# Patient Record
Sex: Female | Born: 1965 | Race: Black or African American | Hispanic: No | Marital: Single | Smoking: Never smoker
Health system: Southern US, Community
[De-identification: ages and names within clinical notes are randomized; demographics above are authoritative.]

## PROBLEM LIST (undated history)

## (undated) DIAGNOSIS — IMO0001 Reserved for inherently not codable concepts without codable children: Secondary | ICD-10-CM

## (undated) DIAGNOSIS — R51 Headache: Secondary | ICD-10-CM

## (undated) DIAGNOSIS — K219 Gastro-esophageal reflux disease without esophagitis: Secondary | ICD-10-CM

## (undated) DIAGNOSIS — I10 Essential (primary) hypertension: Secondary | ICD-10-CM

## (undated) DIAGNOSIS — R519 Headache, unspecified: Secondary | ICD-10-CM

## (undated) DIAGNOSIS — F419 Anxiety disorder, unspecified: Secondary | ICD-10-CM

## (undated) HISTORY — PX: COLONOSCOPY: SHX174

## (undated) HISTORY — DX: Essential (primary) hypertension: I10

---

## 2008-03-17 HISTORY — PX: ABDOMINAL HYSTERECTOMY: SHX81

## 2009-11-20 ENCOUNTER — Emergency Department (HOSPITAL_COMMUNITY): Admission: EM | Admit: 2009-11-20 | Discharge: 2009-11-20 | Payer: Self-pay | Admitting: Family Medicine

## 2012-03-03 ENCOUNTER — Other Ambulatory Visit: Payer: Self-pay | Admitting: Otolaryngology

## 2012-03-03 DIAGNOSIS — R1310 Dysphagia, unspecified: Secondary | ICD-10-CM

## 2012-03-03 DIAGNOSIS — K219 Gastro-esophageal reflux disease without esophagitis: Secondary | ICD-10-CM

## 2012-03-23 ENCOUNTER — Inpatient Hospital Stay: Admission: RE | Admit: 2012-03-23 | Payer: Self-pay | Source: Ambulatory Visit

## 2012-05-01 IMAGING — CR DG SHOULDER 2+V*R*
3 series · 3 of 3 positions shown · non-contrast
Comparison: None.

CLINICAL DATA: Lateral right shoulder pain, no trauma

RIGHT SHOULDER - 2+ VIEW

[view not recorded (1 of 3)]
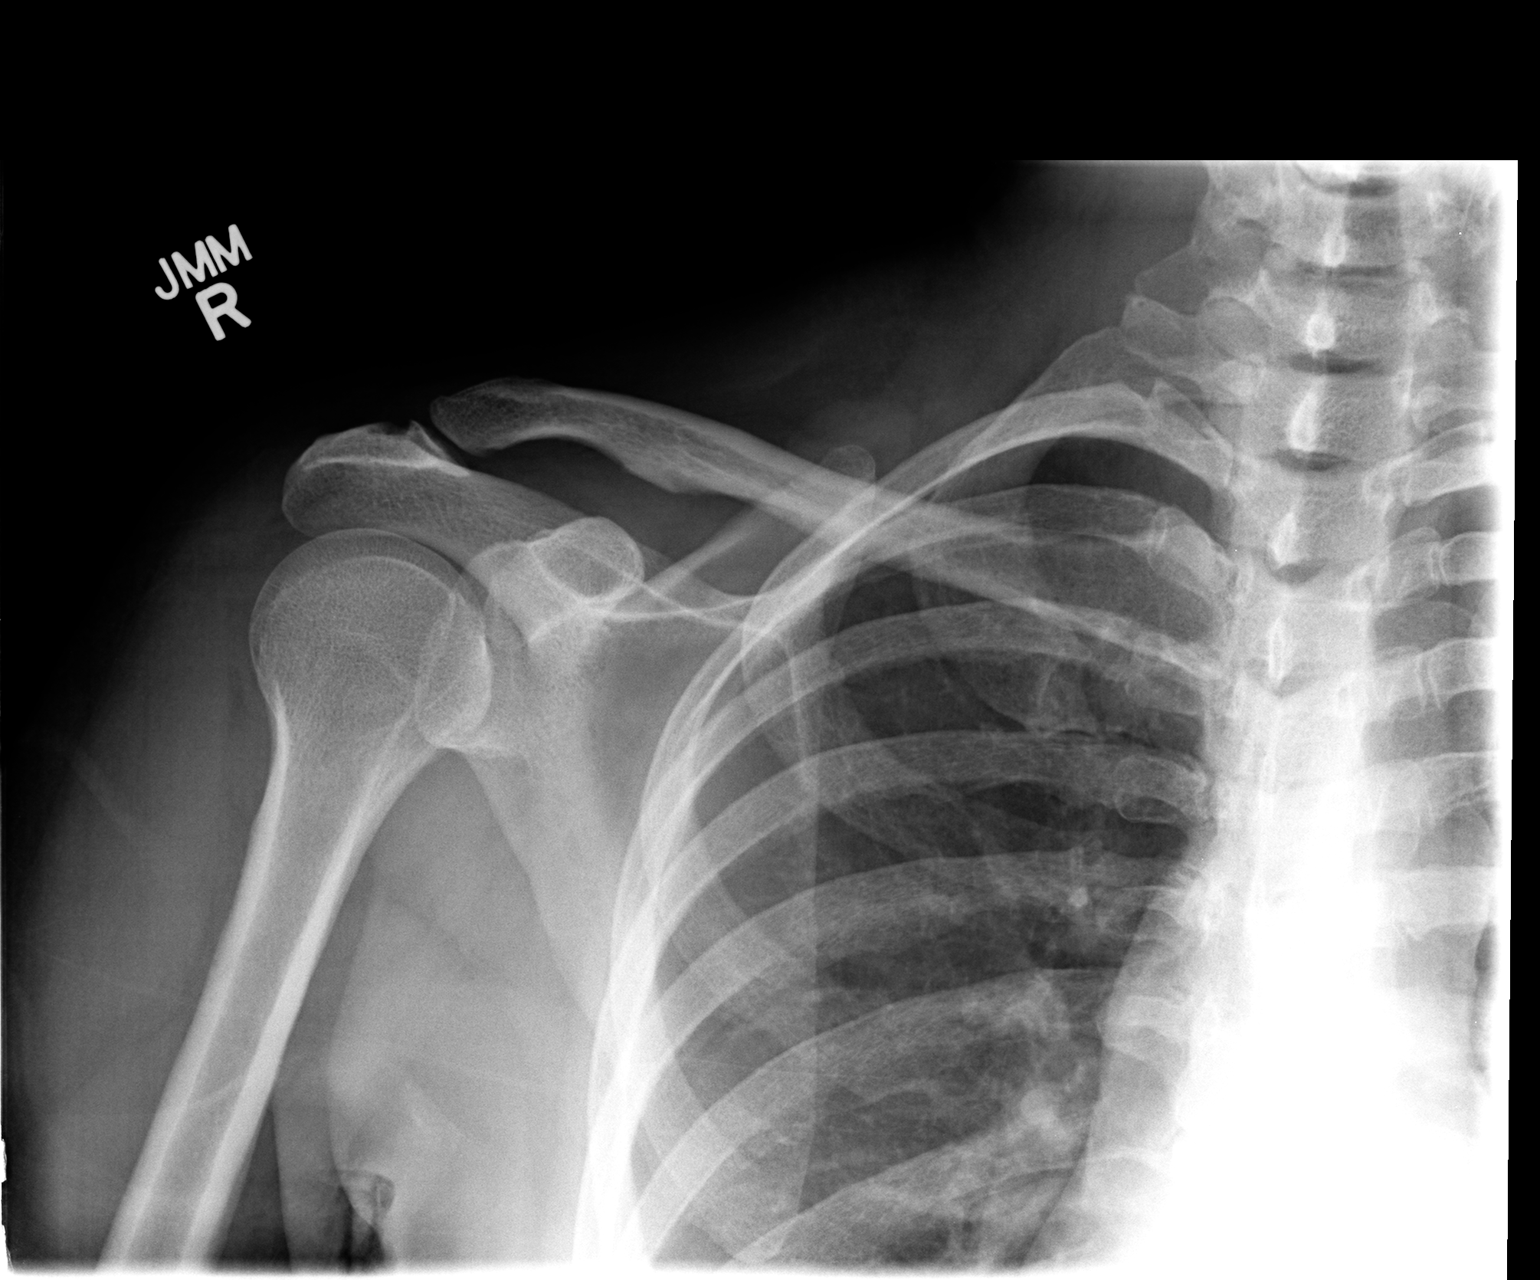

[view not recorded (2 of 3)]
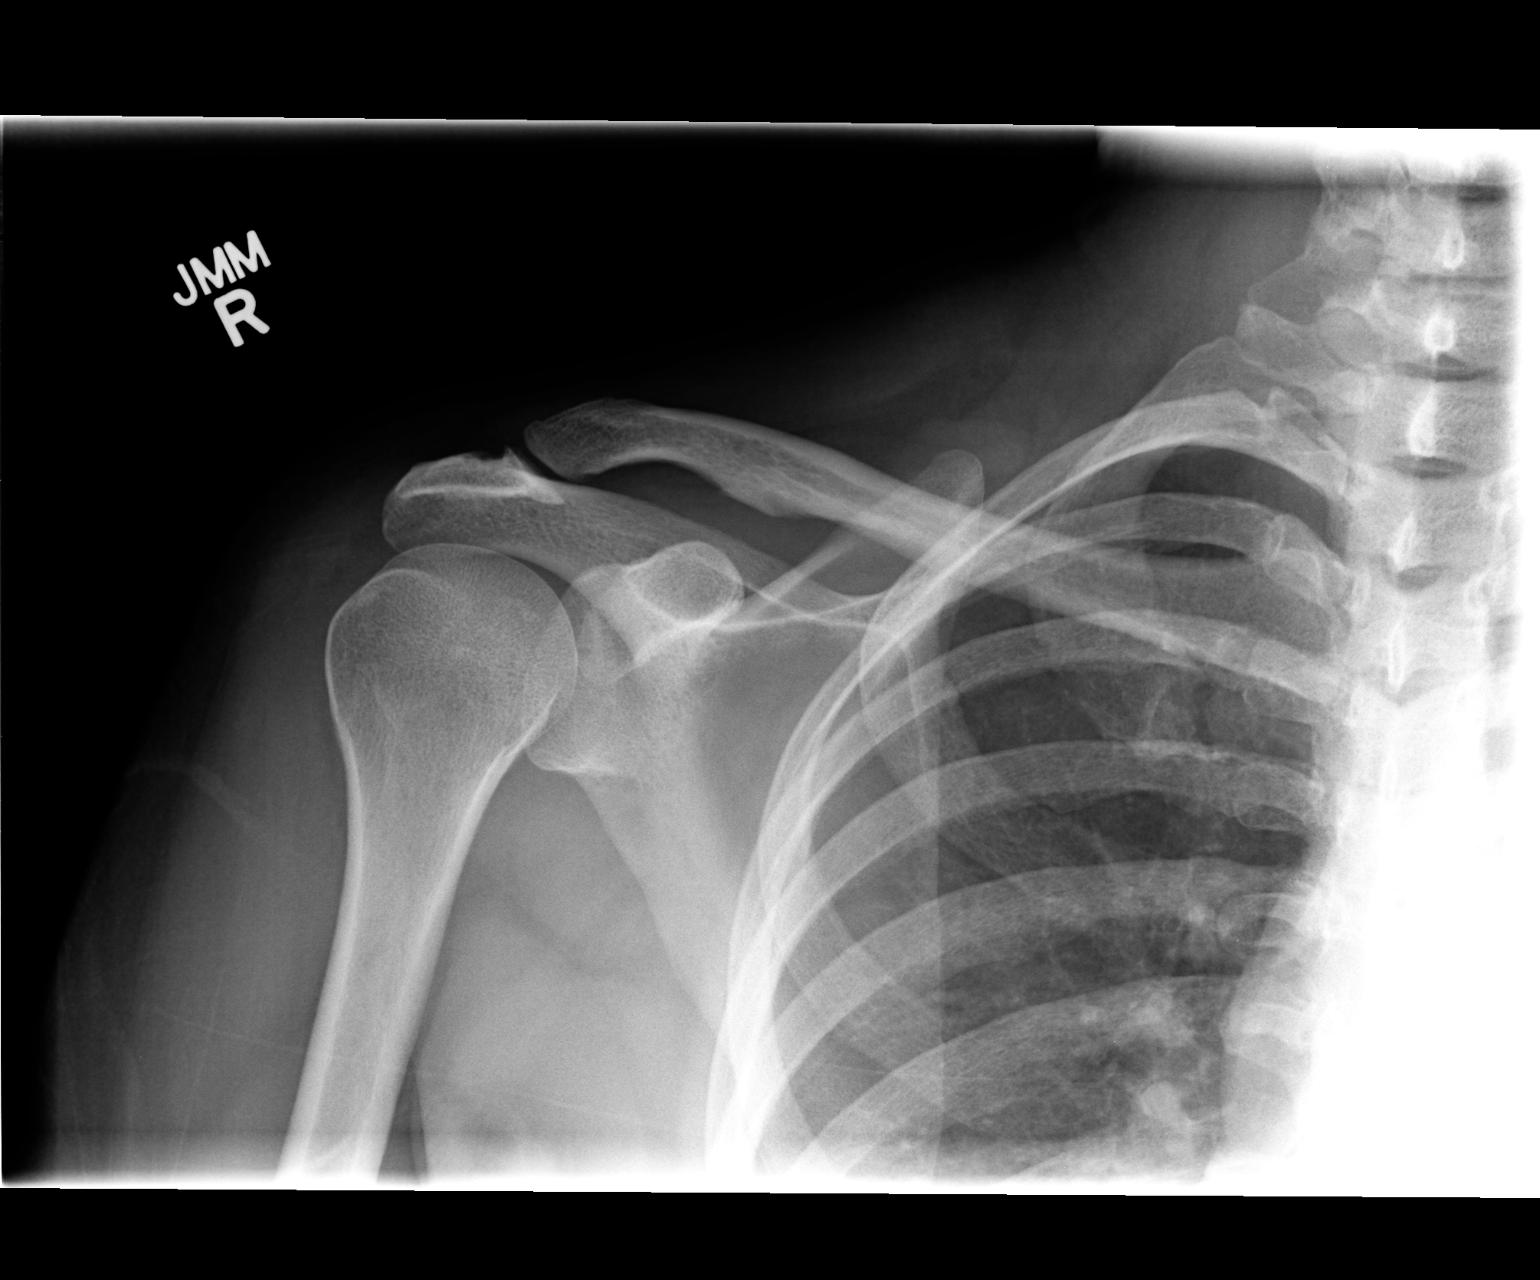

[view not recorded (3 of 3)]
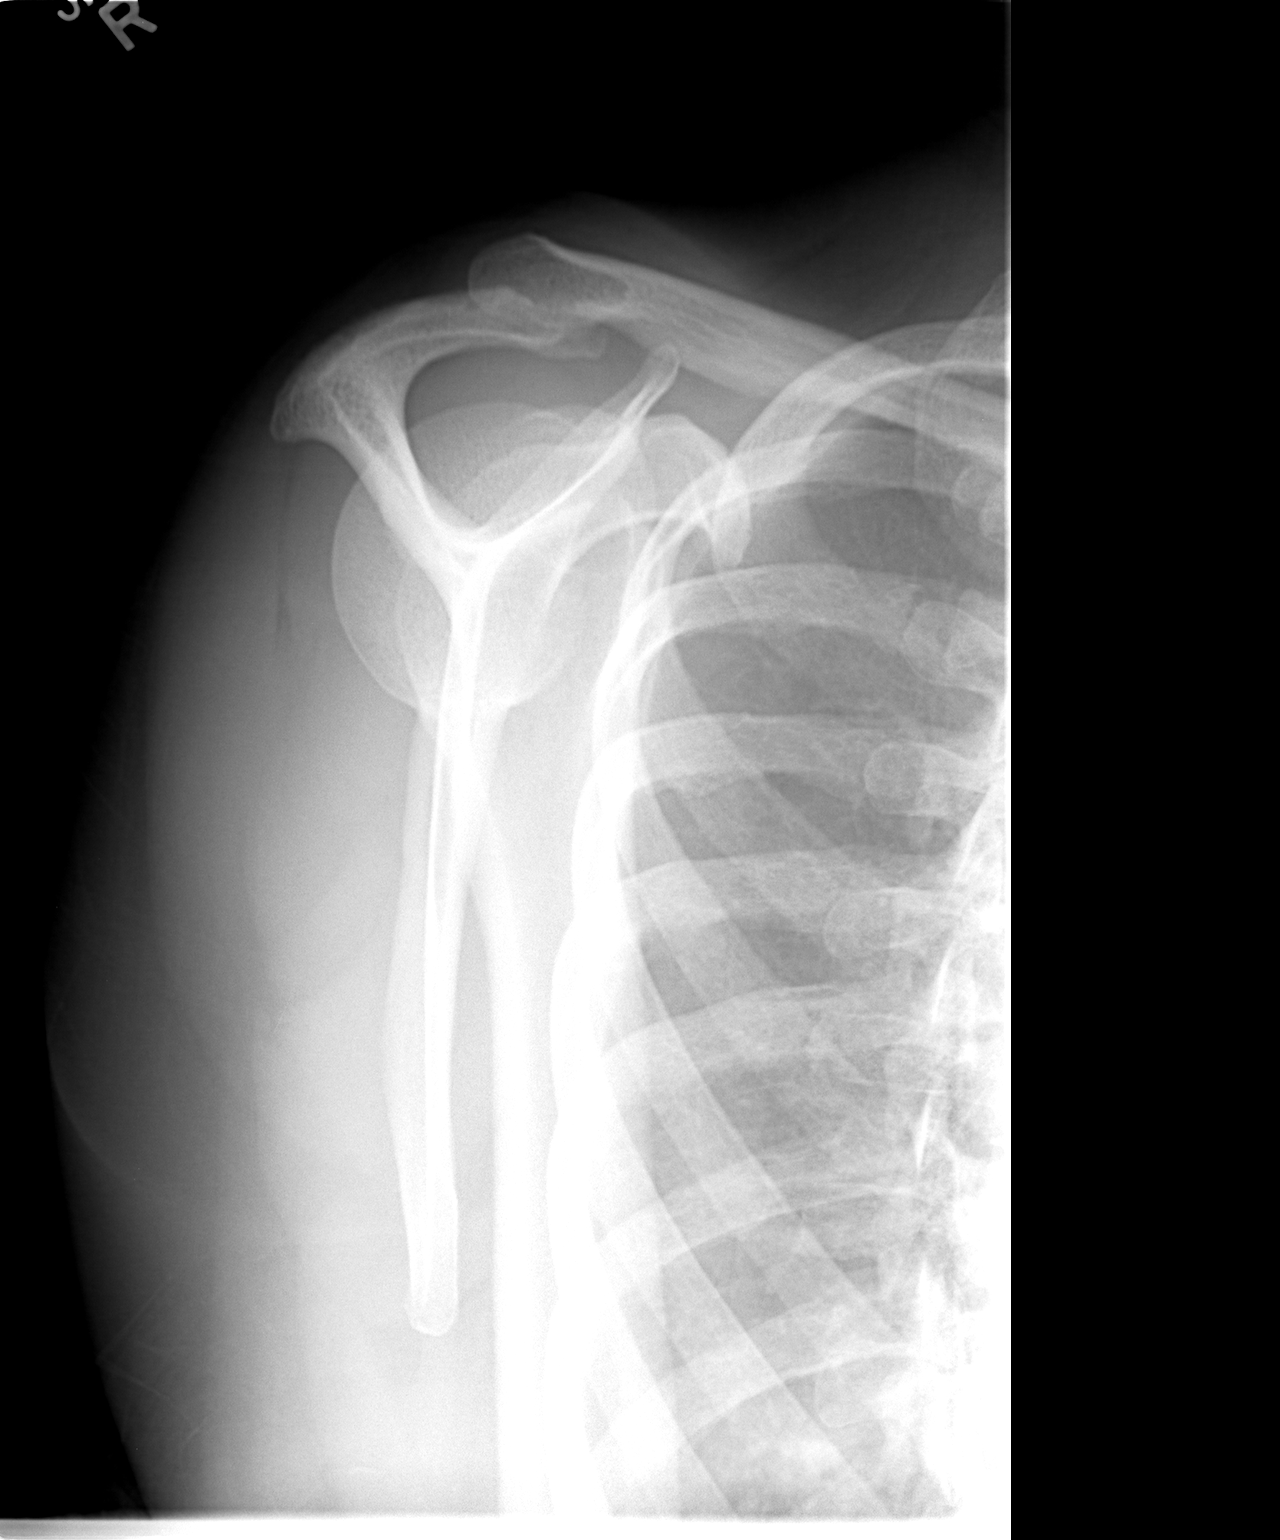

[3 of 3 positions shown; findings below may reference images not displayed]

FINDINGS: External rotation view is suboptimally rotated.  AC joint
distance is grossly normal.  No fracture dislocation.  Right lung
apex clear.
IMPRESSION: No acute osseous abnormality.

## 2013-03-17 HISTORY — PX: ESOPHAGEAL DILATION: SHX303

## 2014-05-15 ENCOUNTER — Other Ambulatory Visit: Payer: Self-pay | Admitting: Otolaryngology

## 2014-05-15 NOTE — H&P (Signed)
PREOPERATIVE H&P  Chief Complaint: sore throats  HPI: Samantha Estrada is a 49 y.o. female who presents for evaluation of recurrent sore throats and trouble breathing through her nose. She has large cryptic tonsils and a long uvula. Nasal exam reveals large turbinates and minimal septal deformity. She's taken to the OR for tonsillectomy and turbinate reductions.  No past medical history on file. No past surgical history on file. History   Social History  . Marital Status: Single    Spouse Name: N/A  . Number of Children: N/A  . Years of Education: N/A   Social History Main Topics  . Smoking status: Not on file  . Smokeless tobacco: Not on file  . Alcohol Use: Not on file  . Drug Use: Not on file  . Sexual Activity: Not on file   Other Topics Concern  . Not on file   Social History Narrative  . No narrative on file   No family history on file. Allergies not on file Prior to Admission medications   Not on File     Positive ROS: per HPI  All other systems have been reviewed and were otherwise negative with the exception of those mentioned in the HPI and as above.  Physical Exam: There were no vitals filed for this visit.  General: Alert, no acute distress Oral: Normal oral mucosa and 2+ tonsils, long uvula Nasal: Clear nasal passages. Large inferior turbinates Neck: No palpable adenopathy or thyroid nodules Ear: Ear canal is clear with normal appearing TMs Cardiovascular: Regular rate and rhythm, no murmur.  Respiratory: Clear to auscultation Neurologic: Alert and oriented x 3   Assessment/Plan: CHRONIC TONSILITIS/TURBINATE HYPERTROPHY Plan for Procedure(s): BILATERAL TONSILLECTOMY BILATERAL TURBINATE REDUCTION   Dillard CannonNEWMAN, Sarie Stall, MD 05/15/2014 3:41 PM

## 2014-05-16 ENCOUNTER — Encounter (HOSPITAL_BASED_OUTPATIENT_CLINIC_OR_DEPARTMENT_OTHER): Payer: Self-pay | Admitting: *Deleted

## 2014-05-16 NOTE — Progress Notes (Signed)
Not on any meds now-to start migraine meds Snores-had sleep study years ago-did not need cpap To bring all meds and overnight bag in case of any resp problems

## 2014-05-19 ENCOUNTER — Encounter (HOSPITAL_BASED_OUTPATIENT_CLINIC_OR_DEPARTMENT_OTHER)
Admission: RE | Disposition: A | Discharge status: Home / Self Care | Payer: Self-pay | Source: Ambulatory Visit | Attending: Otolaryngology

## 2014-05-19 ENCOUNTER — Ambulatory Visit (HOSPITAL_BASED_OUTPATIENT_CLINIC_OR_DEPARTMENT_OTHER): Payer: 59 | Admitting: Anesthesiology

## 2014-05-19 ENCOUNTER — Ambulatory Visit (HOSPITAL_BASED_OUTPATIENT_CLINIC_OR_DEPARTMENT_OTHER)
Admission: RE | Admit: 2014-05-19 | Discharge: 2014-05-19 | Disposition: A | Discharge status: Home / Self Care | Payer: 59 | Source: Ambulatory Visit | Attending: Otolaryngology | Admitting: Otolaryngology

## 2014-05-19 ENCOUNTER — Encounter (HOSPITAL_BASED_OUTPATIENT_CLINIC_OR_DEPARTMENT_OTHER): Payer: Self-pay | Admitting: *Deleted

## 2014-05-19 DIAGNOSIS — J3501 Chronic tonsillitis: Secondary | ICD-10-CM | POA: Diagnosis present

## 2014-05-19 DIAGNOSIS — K219 Gastro-esophageal reflux disease without esophagitis: Secondary | ICD-10-CM | POA: Diagnosis not present

## 2014-05-19 DIAGNOSIS — J343 Hypertrophy of nasal turbinates: Secondary | ICD-10-CM | POA: Diagnosis not present

## 2014-05-19 DIAGNOSIS — J342 Deviated nasal septum: Secondary | ICD-10-CM | POA: Diagnosis not present

## 2014-05-19 HISTORY — DX: Anxiety disorder, unspecified: F41.9

## 2014-05-19 HISTORY — PX: TURBINATE REDUCTION: SHX6157

## 2014-05-19 HISTORY — DX: Headache, unspecified: R51.9

## 2014-05-19 HISTORY — DX: Reserved for inherently not codable concepts without codable children: IMO0001

## 2014-05-19 HISTORY — DX: Gastro-esophageal reflux disease without esophagitis: K21.9

## 2014-05-19 HISTORY — PX: TONSILLECTOMY: SHX5217

## 2014-05-19 HISTORY — DX: Headache: R51

## 2014-05-19 LAB — POCT HEMOGLOBIN-HEMACUE: HEMOGLOBIN: 14 g/dL (ref 12.0–15.0)

## 2014-05-19 SURGERY — TONSILLECTOMY
Anesthesia: General | Site: Nose | Laterality: Bilateral

## 2014-05-19 MED ORDER — PROPOFOL 10 MG/ML IV BOLUS
INTRAVENOUS | Status: DC | PRN
  Administered 2014-05-19: 100 mg via INTRAVENOUS
  Administered 2014-05-19 (×2): 30 mg via INTRAVENOUS
  Administered 2014-05-19: 150 mg via INTRAVENOUS

## 2014-05-19 MED ORDER — METHYLPREDNISOLONE ACETATE 80 MG/ML IJ SUSP
INTRAMUSCULAR | Status: DC | PRN
  Administered 2014-05-19: 80 mg

## 2014-05-19 MED ORDER — LACTATED RINGERS IV SOLN
INTRAVENOUS | Status: DC
  Administered 2014-05-19 (×2): via INTRAVENOUS

## 2014-05-19 MED ORDER — SODIUM CHLORIDE 0.9 % IJ SOLN
INTRAMUSCULAR | Status: AC
  Filled 2014-05-19: qty 10

## 2014-05-19 MED ORDER — OXYMETAZOLINE HCL 0.05 % NA SOLN
NASAL | Status: DC | PRN
  Administered 2014-05-19: 1 via NASAL

## 2014-05-19 MED ORDER — LIDOCAINE-EPINEPHRINE 1 %-1:100000 IJ SOLN
INTRAMUSCULAR | Status: AC
  Filled 2014-05-19: qty 1

## 2014-05-19 MED ORDER — METOPROLOL TARTRATE 1 MG/ML IV SOLN
INTRAVENOUS | Status: DC | PRN
  Administered 2014-05-19: 2.5 mg via INTRAVENOUS

## 2014-05-19 MED ORDER — PROMETHAZINE HCL 25 MG/ML IJ SOLN
6.2500 mg | Freq: Once | INTRAMUSCULAR | Status: AC
  Administered 2014-05-19: 6.25 mg via INTRAVENOUS

## 2014-05-19 MED ORDER — PROPOFOL 10 MG/ML IV BOLUS
INTRAVENOUS | Status: AC
  Filled 2014-05-19: qty 140

## 2014-05-19 MED ORDER — LIDOCAINE HCL (CARDIAC) 20 MG/ML IV SOLN
INTRAVENOUS | Status: DC | PRN
  Administered 2014-05-19: 30 mg via INTRAVENOUS

## 2014-05-19 MED ORDER — METHYLPREDNISOLONE ACETATE 80 MG/ML IJ SUSP
INTRAMUSCULAR | Status: AC
Start: 2014-05-19 — End: 2014-05-19
  Filled 2014-05-19: qty 1

## 2014-05-19 MED ORDER — BACITRACIN ZINC 500 UNIT/GM EX OINT
TOPICAL_OINTMENT | CUTANEOUS | Status: AC
  Filled 2014-05-19: qty 0.9

## 2014-05-19 MED ORDER — HYDROMORPHONE HCL 1 MG/ML IJ SOLN: 0.2500 mg | INTRAMUSCULAR | Status: DC | PRN

## 2014-05-19 MED ORDER — OXYMETAZOLINE HCL 0.05 % NA SOLN
NASAL | Status: AC
  Filled 2014-05-19: qty 15

## 2014-05-19 MED ORDER — FENTANYL CITRATE 0.05 MG/ML IJ SOLN: 50.0000 ug | INTRAMUSCULAR | Status: DC | PRN

## 2014-05-19 MED ORDER — HYDROCODONE-ACETAMINOPHEN 7.5-325 MG/15ML PO SOLN
10.0000 mL | Freq: Four times a day (QID) | ORAL | Status: DC | PRN
  Administered 2014-05-19: 10 mL via ORAL

## 2014-05-19 MED ORDER — LIDOCAINE-EPINEPHRINE 1 %-1:100000 IJ SOLN
INTRAMUSCULAR | Status: DC | PRN
  Administered 2014-05-19: 5 mL

## 2014-05-19 MED ORDER — SUCCINYLCHOLINE CHLORIDE 20 MG/ML IJ SOLN
INTRAMUSCULAR | Status: DC | PRN
  Administered 2014-05-19: 50 mg via INTRAVENOUS

## 2014-05-19 MED ORDER — MIDAZOLAM HCL 2 MG/2ML IJ SOLN
INTRAMUSCULAR | Status: AC
  Filled 2014-05-19: qty 2

## 2014-05-19 MED ORDER — SODIUM CHLORIDE 0.9 % IJ SOLN
INTRAMUSCULAR | Status: DC | PRN
  Administered 2014-05-19: 5 mL via INTRAVENOUS

## 2014-05-19 MED ORDER — FENTANYL CITRATE 0.05 MG/ML IJ SOLN
INTRAMUSCULAR | Status: DC | PRN
  Administered 2014-05-19 (×2): 50 ug via INTRAVENOUS
  Administered 2014-05-19 (×2): 25 ug via INTRAVENOUS
  Administered 2014-05-19: 50 ug via INTRAVENOUS

## 2014-05-19 MED ORDER — MIDAZOLAM HCL 2 MG/2ML IJ SOLN: 1.0000 mg | INTRAMUSCULAR | Status: DC | PRN

## 2014-05-19 MED ORDER — SODIUM CHLORIDE 0.9 % IJ SOLN: INTRAMUSCULAR | Status: DC | PRN

## 2014-05-19 MED ORDER — ONDANSETRON HCL 4 MG/2ML IJ SOLN
INTRAMUSCULAR | Status: DC | PRN
  Administered 2014-05-19: 4 mg via INTRAVENOUS

## 2014-05-19 MED ORDER — HYDROCODONE-ACETAMINOPHEN 7.5-325 MG/15ML PO SOLN: 10.0000 mL | ORAL | Status: AC | PRN

## 2014-05-19 MED ORDER — MIDAZOLAM HCL 5 MG/5ML IJ SOLN
INTRAMUSCULAR | Status: DC | PRN
  Administered 2014-05-19: 2 mg via INTRAVENOUS

## 2014-05-19 MED ORDER — FENTANYL CITRATE 0.05 MG/ML IJ SOLN
INTRAMUSCULAR | Status: AC
  Filled 2014-05-19: qty 6

## 2014-05-19 MED ORDER — CEFAZOLIN SODIUM-DEXTROSE 2-3 GM-% IV SOLR
INTRAVENOUS | Status: AC
  Filled 2014-05-19: qty 50

## 2014-05-19 MED ORDER — AMOXICILLIN 250 MG/5ML PO SUSR: 500.0000 mg | Freq: Two times a day (BID) | ORAL | Status: AC

## 2014-05-19 MED ORDER — HYDROCODONE-ACETAMINOPHEN 7.5-325 MG/15ML PO SOLN
ORAL | Status: AC
  Filled 2014-05-19: qty 15

## 2014-05-19 MED ORDER — CEFAZOLIN SODIUM-DEXTROSE 2-3 GM-% IV SOLR
2.0000 g | INTRAVENOUS | Status: AC
  Administered 2014-05-19: 2 g via INTRAVENOUS

## 2014-05-19 MED ORDER — DEXAMETHASONE SODIUM PHOSPHATE 4 MG/ML IJ SOLN
INTRAMUSCULAR | Status: DC | PRN
  Administered 2014-05-19: 10 mg via INTRAVENOUS

## 2014-05-19 MED ORDER — PROPOFOL 10 MG/ML IV EMUL
INTRAVENOUS | Status: AC
  Filled 2014-05-19: qty 50

## 2014-05-19 MED ORDER — PROMETHAZINE HCL 25 MG/ML IJ SOLN
INTRAMUSCULAR | Status: AC
  Filled 2014-05-19: qty 1

## 2014-05-19 SURGICAL SUPPLY — 52 items
APPLICATOR COTTON TIP 6IN STRL (MISCELLANEOUS) IMPLANT
BANDAGE COBAN STERILE 2 (GAUZE/BANDAGES/DRESSINGS) IMPLANT
BLADE INF TURB ROT M4 2 5PK (BLADE) IMPLANT
BLADE INF TURB ROT M4 2MM 5PK (BLADE)
BLADE SURG 15 STRL LF DISP TIS (BLADE) ×2 IMPLANT
BLADE SURG 15 STRL SS (BLADE) ×2
CANISTER SUCT 1200ML W/VALVE (MISCELLANEOUS) ×4 IMPLANT
CATH ROBINSON RED A/P 12FR (CATHETERS) IMPLANT
COAGULATOR SUCT 6 FR SWTCH (ELECTROSURGICAL)
COAGULATOR SUCT 8FR VV (MISCELLANEOUS) IMPLANT
COAGULATOR SUCT SWTCH 10FR 6 (ELECTROSURGICAL) IMPLANT
COVER MAYO STAND STRL (DRAPES) ×4 IMPLANT
DECANTER SPIKE VIAL GLASS SM (MISCELLANEOUS) IMPLANT
ELECT COATED BLADE 2.86 ST (ELECTRODE) ×4 IMPLANT
ELECT REM PT RETURN 9FT ADLT (ELECTROSURGICAL)
ELECT REM PT RETURN 9FT PED (ELECTROSURGICAL)
ELECTRODE REM PT RETRN 9FT PED (ELECTROSURGICAL) IMPLANT
ELECTRODE REM PT RTRN 9FT ADLT (ELECTROSURGICAL) IMPLANT
GLOVE BIO SURGEON STRL SZ 6.5 (GLOVE) ×3 IMPLANT
GLOVE BIO SURGEON STRL SZ7.5 (GLOVE) ×4 IMPLANT
GLOVE BIO SURGEONS STRL SZ 6.5 (GLOVE) ×1
GLOVE BIOGEL PI IND STRL 7.0 (GLOVE) ×2 IMPLANT
GLOVE BIOGEL PI INDICATOR 7.0 (GLOVE) ×2
GLOVE SS BIOGEL STRL SZ 7.5 (GLOVE) ×2 IMPLANT
GLOVE SUPERSENSE BIOGEL SZ 7.5 (GLOVE) ×2
GOWN STRL REUS W/ TWL LRG LVL3 (GOWN DISPOSABLE) ×2 IMPLANT
GOWN STRL REUS W/TWL LRG LVL3 (GOWN DISPOSABLE) ×2
IV NS 500ML (IV SOLUTION)
IV NS 500ML BAXH (IV SOLUTION) IMPLANT
MARKER SKIN DUAL TIP RULER LAB (MISCELLANEOUS) IMPLANT
NEEDLE PRECISIONGLIDE 27X1.5 (NEEDLE) IMPLANT
NS IRRIG 1000ML POUR BTL (IV SOLUTION) ×4 IMPLANT
PACK BASIN DAY SURGERY FS (CUSTOM PROCEDURE TRAY) ×4 IMPLANT
PENCIL FOOT CONTROL (ELECTRODE) ×4 IMPLANT
SHEET MEDIUM DRAPE 40X70 STRL (DRAPES) ×4 IMPLANT
SLEEVE SCD COMPRESS KNEE MED (MISCELLANEOUS) ×4 IMPLANT
SOLUTION BUTLER CLEAR DIP (MISCELLANEOUS) IMPLANT
SPONGE GAUZE 2X2 8PLY STER LF (GAUZE/BANDAGES/DRESSINGS) ×1
SPONGE GAUZE 2X2 8PLY STRL LF (GAUZE/BANDAGES/DRESSINGS) ×3 IMPLANT
SPONGE GAUZE 4X4 12PLY STER LF (GAUZE/BANDAGES/DRESSINGS) ×4 IMPLANT
SPONGE TONSIL 1 RF SGL (DISPOSABLE) IMPLANT
SPONGE TONSIL 1.25 RF SGL STRG (GAUZE/BANDAGES/DRESSINGS) IMPLANT
SUT CHROMIC 4 0 PS 2 18 (SUTURE) IMPLANT
SUT ETHILON 3 0 PS 1 (SUTURE) IMPLANT
SUT SILK 2 0 FS (SUTURE) IMPLANT
SYR 3ML 18GX1 1/2 (SYRINGE) IMPLANT
SYR BULB 3OZ (MISCELLANEOUS) ×4 IMPLANT
SYR CONTROL 10ML LL (SYRINGE) IMPLANT
TOWEL OR 17X24 6PK STRL BLUE (TOWEL DISPOSABLE) ×8 IMPLANT
TUBE CONNECTING 20'X1/4 (TUBING) ×1
TUBE CONNECTING 20X1/4 (TUBING) ×3 IMPLANT
YANKAUER SUCT BULB TIP NO VENT (SUCTIONS) ×4 IMPLANT

## 2014-05-19 NOTE — Anesthesia Procedure Notes (Signed)
Procedure Name: Intubation Date/Time: 05/19/2014 7:43 AM Performed by: Wilton DesanctisLINKA, Juanitta Earnhardt L Pre-anesthesia Checklist: Patient identified, Emergency Drugs available, Suction available, Patient being monitored and Timeout performed Patient Re-evaluated:Patient Re-evaluated prior to inductionOxygen Delivery Method: Circle System Utilized Preoxygenation: Pre-oxygenation with 100% oxygen Intubation Type: IV induction Ventilation: Mask ventilation without difficulty Laryngoscope Size: Miller and 3 Tube type: Oral Tube size: 7.0 mm Number of attempts: 1 Airway Equipment and Method: Stylet and Oral airway Placement Confirmation: ETT inserted through vocal cords under direct vision,  positive ETCO2 and breath sounds checked- equal and bilateral Secured at: 20 cm Tube secured with: Tape Dental Injury: Teeth and Oropharynx as per pre-operative assessment

## 2014-05-19 NOTE — Interval H&P Note (Signed)
History and Physical Interval Note:  05/19/2014 7:26 AM  Samantha Estrada  has presented today for surgery, with the diagnosis of CHRONIC TONSILITIS/TURBINATE HYPERTROPHY  The various methods of treatment have been discussed with the patient and family. After consideration of risks, benefits and other options for treatment, the patient has consented to  Procedure(s): BILATERAL TONSILLECTOMY (Bilateral) BILATERAL TURBINATE REDUCTION (Bilateral) as a surgical intervention .  The patient's history has been reviewed, patient examined, no change in status, stable for surgery.  I have reviewed the patient's chart and labs.  Questions were answered to the patient's satisfaction.     NEWMAN, CHRISTOPHER

## 2014-05-19 NOTE — Anesthesia Postprocedure Evaluation (Signed)
  Anesthesia Post-op Note  Patient: Samantha ShellerSheryl Estrada  Procedure(s) Performed: Procedure(s): BILATERAL TONSILLECTOMY (Bilateral) BILATERAL TURBINATE REDUCTION (Bilateral)  Patient Location: PACU  Anesthesia Type:General  Level of Consciousness: awake and alert   Airway and Oxygen Therapy: Patient Spontanous Breathing  Post-op Pain: moderate  Post-op Assessment: Post-op Vital signs reviewed, Patient's Cardiovascular Status Stable and Respiratory Function Stable  Post-op Vital Signs: Reviewed  Filed Vitals:   05/19/14 1000  BP: 144/106  Pulse: 74  Temp:   Resp: 13    Complications: No apparent anesthesia complications

## 2014-05-19 NOTE — Discharge Instructions (Addendum)
Instructions for Home Care After Tonsillectomy  First Day Home: Encourage fluid intake by frequently offering liquids, soup, ice cream jello, etc.  Drink several glasses of water.  Cooler fluids are best.  Avoid hot and highly seasoned foods.  Orange juice, grapefruit juice and tomato juice may cause stinging sensation because of their acidic content.    Second and Third Day Home: Continue liquids and add soft foods, (pudding, macaroni and cheese, mashed potatoes, soft scrambled eggs, etc.).  Make sure you drink plenty of liquids so you do not get dehydrated.  Fifth Thru Seventh Day Home: Gradually resume a normal diet, but avoid hot foods, potato chips, nuts, toast and crackers until 2 weeks after surgery.  General Instructions   No undue physical exertion or exercise for one week.  Children: Tylenol may be used for discomfort and/or fever.  Use as often as necessary within limits of the directions.  Adults: May spray throat with Chloroseptic or other topical anesthetic for discomfort and use pain medication obtained by prescription as directed.    A slight fever (up to 101) is expected for the first the first couple of days.  Take Tylenol (or aspirin substitute) as directed.  Pain in ears is common after tonsillectomy.  It represents pain referred from the throat where the tonsils were removed.  There is usually nothing wrong with the ears in most cases.  Administer Tylenol as needed to control this pain.  White patches will form where the tonsils were removed.  This is perfectly normal.  They will disappear in one to two weeks.  Mouth odor may be notice during the healing stages.  Do not use aspirin for two weeks; it increases the possibility of bleeding.  In a very small percentage of people, there is some bleeding after five to six days.  If this happens, do not become excited, for the bleeding is usually light.  Be quiet, lie down, and spit the blood out gently.  Gargle the throat  with ice water.  If the bleeding does not stop promptly, call the office [phone]), which answers 24 hours a day.  A follow up appointment should be made with Samantha Estrada 10-14 days following surgery. Please call [phone] for the appointment time.  Tylenol, motrin or Hydrocodone Elixir 2 - 3 tsp (10-15 cc) every 4 hrs prn pain  Amoxicillin 500 mg twice per day for the next week   Post Anesthesia Home Care Instructions  Activity: Get plenty of rest for the remainder of the day. A responsible adult should stay with you for 24 hours following the procedure.  For the next 24 hours, DO NOT: -Drive a car -Advertising copywriterperate machinery -Drink alcoholic beverages -Take any medication unless instructed by your physician -Make any legal decisions or sign important papers.  Meals: Start with liquid foods such as gelatin or soup. Progress to regular foods as tolerated. Avoid greasy, spicy, heavy foods. If nausea and/or vomiting occur, drink only clear liquids until the nausea and/or vomiting subsides. Call your physician if vomiting continues.  Special Instructions/Symptoms: Your throat may feel dry or sore from the anesthesia or the breathing tube placed in your throat during surgery. If this causes discomfort, gargle with warm salt water. The discomfort should disappear within 24 hours.

## 2014-05-19 NOTE — Op Note (Signed)
NAMImagene Sheller:  Hornback, Chade               ACCOUNT NO.:  [id number]  MEDICAL RECORD NO.:  [id number]  LOCATION:                                 FACILITY:  PHYSICIAN:  Kristine GarbeChristopher E. Ezzard StandingNewman, M.D. DATE OF BIRTH:  DATE OF PROCEDURE:  05/19/2014 DATE OF DISCHARGE:                              OPERATIVE REPORT   PREOPERATIVE DIAGNOSES: 1. Recurrent chronic tonsillitis. 2. Turbinate hypertrophy with nasal obstruction.  POSTOPERATIVE DIAGNOSES: 1. Recurrent chronic tonsillitis. 2. Turbinate hypertrophy with nasal obstruction.  OPERATION PERFORMED:  Tonsillectomy with bilateral inferior turbinate reductions with the Medtronic turbinate blade.  SURGEON:  Kristine GarbeChristopher E. Ezzard StandingNewman, M.D.  ANESTHESIA:  General endotracheal.  COMPLICATIONS:  None.  BRIEF CLINICAL NOTE:  The patient is a 49 year old female who has had a history of recurrent sore throats as well as a large amount of mucus building up in the back of her throat and nasal obstruction.  She is a chronic snorer but has had a previous sleep test which did not demonstrate any significant sleep apnea.  She complains a lot of postnasal drainage and intermittent nasal obstruction.  On exam, she has a slight septal deviation to the right.  She has large swollen turbinates, no polyps or obstructing lesions otherwise.  Oral exam reveals 2+ cryptic tonsils, elongated uvula.  She was taken to the operating room this time for tonsillectomy and bilateral inferior turbinate reductions to help improve her sore throats as well as nasal obstruction.  DESCRIPTION OF PROCEDURE:  After adequate endotracheal anesthesia, a mouth gag was used to expose the oropharynx.  The left and right tonsils were resected from tonsillar fossa using a cautery.  Hemostasis was obtained with a cautery.  Following tonsillectomy, she had a very elongated edematous uvula.  The uvula was transected toward its base. This completed the tonsillectomy.  Next, the nose was  examined.  She had large swollen inferior turbinates bilaterally.  Using the Medtronic turbinate blade, submucous reduction of the turbinates was performed bilaterally.  The remaining turbinate bone was outfractured and suction cautery was used to cauterize the distal inferior tips of the inferior turbinates bilaterally.  Bleeding was controlled with suction cautery. Following completion of the turbinate reductions, the inferior turbinates were injected with 40 mg of steroid injection of Depo-Medrol bilaterally.  This completed the procedure.  The patient was awoken from anesthesia and transferred to recovery room, postop doing well.  We will plan on discharging her home later this morning on amoxicillin suspension 5 mg b.i.d. for [redacted] week along with Tylenol, Motrin, and hydrocodone elixir 2-3 teaspoons q.4 hours p.r.n. pain.  She will follow up in my office in 10-14 days for recheck.          ______________________________ Kristine Garbehristopher E. Ezzard StandingNewman, M.D.     CEN/MEDQ  D:  05/19/2014  T:  05/19/2014  Job:  [id number]

## 2014-05-19 NOTE — Brief Op Note (Signed)
05/19/2014  8:37 AM  PATIENT:  Samantha Estrada  49 y.o. female  PRE-OPERATIVE DIAGNOSIS:  CHRONIC TONSILITIS/TURBINATE HYPERTROPHY  POST-OPERATIVE DIAGNOSIS:  CHRONIC TONSILITIS/TURBINATE   PROCEDURE:  Procedure(s): BILATERAL TONSILLECTOMY (Bilateral) BILATERAL TURBINATE REDUCTION (Bilateral)  SURGEON:  Surgeon(s) and Role:    * Drema Halonhristopher E Cheri Ayotte, MD - Primary  PHYSICIAN ASSISTANT:   ASSISTANTS: none   ANESTHESIA:   general  EBL:  Total I/O In: 1400 [I.V.:1400] Out: -   BLOOD ADMINISTERED:none  DRAINS: none   LOCAL MEDICATIONS USED:  LIDOCAINE with EPI  4 cc  SPECIMEN:  No Specimen  DISPOSITION OF SPECIMEN:  N/A  COUNTS:  YES  TOURNIQUET:  * No tourniquets in log *  DICTATION: .Other Dictation: Dictation Number [id number]  PLAN OF CARE: Discharge to home after PACU  PATIENT DISPOSITION:  PACU - hemodynamically stable.   Delay start of Pharmacological VTE agent (>24hrs) due to surgical blood loss or risk of bleeding: yes

## 2014-05-19 NOTE — Transfer of Care (Signed)
Immediate Anesthesia Transfer of Care Note  Patient: Samantha Estrada  Procedure(s) Performed: Procedure(s): BILATERAL TONSILLECTOMY (Bilateral) BILATERAL TURBINATE REDUCTION (Bilateral)  Patient Location: PACU  Anesthesia Type:General  Level of Consciousness: sedated and patient cooperative  Airway & Oxygen Therapy: Patient Spontanous Breathing, Patient connected to face mask and aerosol face mask  Post-op Assessment: Report given to RN and Post -op Vital signs reviewed and stable  Post vital signs: Reviewed and stable  Last Vitals:  Filed Vitals:   05/19/14 0615  BP: 151/106  Pulse: 74  Temp: 36.6 C  Resp: 16    Complications: No apparent anesthesia complications

## 2014-05-19 NOTE — Anesthesia Preprocedure Evaluation (Signed)
Anesthesia Evaluation  Patient identified by MRN, date of birth, ID band Patient awake    Reviewed: Allergy & Precautions, H&P , NPO status , Patient's Chart, lab work & pertinent test results  Airway Mallampati: II  TM Distance: >3 FB Neck ROM: Full    Dental no notable dental hx. (+) Teeth Intact, Dental Advisory Given   Pulmonary neg pulmonary ROS,  breath sounds clear to auscultation  Pulmonary exam normal       Cardiovascular negative cardio ROS  Rhythm:Regular Rate:Normal     Neuro/Psych  Headaches, negative psych ROS   GI/Hepatic Neg liver ROS, GERD-  Medicated,  Endo/Other  negative endocrine ROS  Renal/GU negative Renal ROS  negative genitourinary   Musculoskeletal   Abdominal   Peds  Hematology negative hematology ROS (+)   Anesthesia Other Findings   Reproductive/Obstetrics negative OB ROS                             Anesthesia Physical Anesthesia Plan  ASA: II  Anesthesia Plan: General   Post-op Pain Management:    Induction: Intravenous  Airway Management Planned: Oral ETT  Additional Equipment:   Intra-op Plan:   Post-operative Plan: Extubation in OR  Informed Consent: I have reviewed the patients History and Physical, chart, labs and discussed the procedure including the risks, benefits and alternatives for the proposed anesthesia with the patient or authorized representative who has indicated his/her understanding and acceptance.   Dental advisory given  Plan Discussed with: CRNA  Anesthesia Plan Comments:         Anesthesia Quick Evaluation

## 2014-05-22 ENCOUNTER — Encounter (HOSPITAL_BASED_OUTPATIENT_CLINIC_OR_DEPARTMENT_OTHER): Payer: Self-pay | Admitting: Otolaryngology

## 2017-08-12 ENCOUNTER — Encounter: Payer: Self-pay | Admitting: Neurology

## 2017-08-14 ENCOUNTER — Encounter: Payer: Self-pay | Admitting: Neurology

## 2017-08-14 ENCOUNTER — Ambulatory Visit (INDEPENDENT_AMBULATORY_CARE_PROVIDER_SITE_OTHER): Payer: 59 | Admitting: Neurology

## 2017-08-14 VITALS — BP 123/82 | HR 67 | Ht 65.0 in | Wt 209.0 lb

## 2017-08-14 DIAGNOSIS — R519 Headache, unspecified: Secondary | ICD-10-CM

## 2017-08-14 DIAGNOSIS — G43711 Chronic migraine without aura, intractable, with status migrainosus: Secondary | ICD-10-CM | POA: Diagnosis not present

## 2017-08-14 DIAGNOSIS — R51 Headache: Secondary | ICD-10-CM

## 2017-08-14 DIAGNOSIS — M7918 Myalgia, other site: Secondary | ICD-10-CM | POA: Insufficient documentation

## 2017-08-14 MED ORDER — AMITRIPTYLINE HCL 10 MG PO TABS
10.0000 mg | ORAL_TABLET | Freq: Every day | ORAL | 11 refills | Status: DC
Start: 2017-08-14 — End: 2019-08-30

## 2017-08-14 MED ORDER — ALPRAZOLAM 0.5 MG PO TABS: ORAL_TABLET | ORAL | 0 refills | Status: DC

## 2017-08-14 NOTE — Patient Instructions (Signed)
MRI of the brain, take Xanax beforehand to help Get Botox approved - every 12 weeks for shots Start Aimovig today Physical therapy at [facility] Start Amitriptyline at night to help with sleep and migraine   Erenumab: Patient drug information Copyright [phone])[phone] [employer] All rights reserved. (For additional information see "Erenumab: Drug information") Brand Names: [place]  Aimovig  What is this drug used for?   It is used to prevent migraine headaches.  What do I need to tell my doctor BEFORE I take this drug?   If you have an allergy to this drug or any part of this drug.   If you are allergic to any drugs like this one, any other drugs, foods, or other substances. Tell your doctor about the allergy and what signs you had, like rash; hives; itching; shortness of breath; wheezing; cough; swelling of face, lips, tongue, or throat; or any other signs.   This drug may interact with other drugs or health problems.   Tell your doctor and pharmacist about all of your drugs (prescription or OTC, natural products, vitamins) and health problems. You must check to make sure that it is safe for you to take this drug with all of your drugs and health problems. Do not start, stop, or change the dose of any drug without checking with your doctor.  What are some things I need to know or do while I take this drug?   Tell all of your health care providers that you take this drug. This includes your doctors, nurses, pharmacists, and dentists.   If you have a latex allergy, talk with your doctor.   Tell your doctor if you are pregnant or plan on getting pregnant. You will need to talk about the benefits and risks of using this drug while you are pregnant.   Tell your doctor if you are breast-feeding. You will need to talk about any risks to your baby.  What are some side effects that I need to call my doctor about right away?   WARNING/CAUTION: Even though it may be rare, some people may have  very bad and sometimes deadly side effects when taking a drug. Tell your doctor or get medical help right away if you have any of the following signs or symptoms that may be related to a very bad side effect:   Signs of an allergic reaction, like rash; hives; itching; red, swollen, blistered, or peeling skin with or without fever; wheezing; tightness in the chest or throat; trouble breathing, swallowing, or talking; unusual hoarseness; or swelling of the mouth, face, lips, tongue, or throat.  What are some other side effects of this drug?   All drugs may cause side effects. However, many people have no side effects or only have minor side effects. Call your doctor or get medical help if any of these side effects or any other side effects bother you or do not go away:   Redness or swelling where the shot is given.   Pain where the shot was given.   Constipation.   These are not all of the side effects that may occur. If you have questions about side effects, call your doctor. Call your doctor for medical advice about side effects.   You may report side effects to your national health agency.  How is this drug best taken?   Use this drug as ordered by your doctor. Read all information given to you. Follow all instructions closely.   It is given  as a shot into the fatty part of the skin on the top of the thigh, belly area, or upper arm.   If you will be giving yourself the shot, your doctor or nurse will teach you how to give the shot.   Follow how to use as you have been told by the doctor or read the package insert.   If stored in a refrigerator, let this drug come to room temperature before using it. Leave it at room temperature for at least 30 minutes. Do not heat this drug.   Protect from heat and sunlight.   Do not shake.   Do not give into skin that is irritated, bruised, red, infected, or scarred.   Do not use if the solution is cloudy, leaking, or has particles.   Do not use if solution  changes color.   Throw away after using. Do not use the device more than 1 time.   Throw away needles in a needle/sharp disposal box. Do not reuse needles or other items. When the box is full, follow all local rules for getting rid of it. Talk with a doctor or pharmacist if you have any questions.  What do I do if I miss a dose?   Take a missed dose as soon as you think about it.   After taking a missed dose, start a new schedule based on when the dose is taken.  How do I store and/or throw out this drug?   Store in a refrigerator. Do not freeze.   Store in the carton to protect from light.   Do not use if it has been frozen.   If you drop this drug on a hard surface, do not use it.   If needed, you may store at room temperature for up to 7 days. Write down the date you take this drug out of the refrigerator. If stored at room temperature and not used within 7 days, throw this drug away.   Do not put this drug back in the refrigerator after it has been stored at room temperature.   Keep all drugs in a safe place. Keep all drugs out of the reach of children and pets.   Throw away unused or expired drugs. Do not flush down a toilet or pour down a drain unless you are told to do so. Check with your pharmacist if you have questions about the best way to throw out drugs. There may be drug take-back programs in your area.  General drug facts   If your symptoms or health problems do not get better or if they become worse, call your doctor.   Do not share your drugs with others and do not take anyone else's drugs.   Keep a list of all your drugs (prescription, natural products, vitamins, OTC) with you. Give this list to your doctor.   Talk with the doctor before starting any new drug, including prescription or OTC, natural products, or vitamins.   Some drugs may have another patient information leaflet. If you have any questions about this drug, please talk with your doctor, nurse, pharmacist, or  other health care provider.   If you think there has been an overdose, call your poison control center or get medical care right away. Be ready to tell or show what was taken, how much, and when it happened.   Amitriptyline tablets What is this medicine? AMITRIPTYLINE (a mee TRIP ti leen) is used to treat depression and migraines. This medicine  may be used for other purposes; ask your health care provider or pharmacist if you have questions. COMMON BRAND NAME(S): Elavil, Vanatrip What should I tell my health care provider before I take this medicine? They need to know if you have any of these conditions: -an alcohol problem -asthma, difficulty breathing -bipolar disorder or schizophrenia -difficulty passing urine, prostate trouble -glaucoma -heart disease or previous heart attack -liver disease -over active thyroid -seizures -thoughts or plans of suicide, a previous suicide attempt, or family history of suicide attempt -an unusual or allergic reaction to amitriptyline, other medicines, foods, dyes, or preservatives -pregnant or trying to get pregnant -breast-feeding How should I use this medicine? Take this medicine by mouth with a drink of water. Follow the directions on the prescription label. You can take the tablets with or without food. Take your medicine at regular intervals. Do not take it more often than directed. Do not stop taking this medicine suddenly except upon the advice of your doctor. Stopping this medicine too quickly may cause serious side effects or your condition may worsen. A special MedGuide will be given to you by the pharmacist with each prescription and refill. Be sure to read this information carefully each time. Talk to your pediatrician regarding the use of this medicine in children. Special care may be needed. Overdosage: If you think you have taken too much of this medicine contact a poison control center or emergency room at once. NOTE: This medicine is  only for you. Do not share this medicine with others. What if I miss a dose? If you miss a dose, take it as soon as you can. If it is almost time for your next dose, take only that dose. Do not take double or extra doses. What may interact with this medicine? Do not take this medicine with any of the following medications: -arsenic trioxide -certain medicines used to regulate abnormal heartbeat or to treat other heart conditions -cisapride -droperidol -halofantrine -linezolid -MAOIs like Carbex, Eldepryl, Marplan, Nardil, and Parnate -methylene blue -other medicines for mental depression -phenothiazines like perphenazine, thioridazine and chlorpromazine -pimozide -probucol -procarbazine -sparfloxacin -St. John's Wort -ziprasidone This medicine may also interact with the following medications: -atropine and related drugs like hyoscyamine, scopolamine, tolterodine and others -barbiturate medicines for inducing sleep or treating seizures, like phenobarbital -cimetidine -disulfiram -ethchlorvynol -thyroid hormones such as levothyroxine This list may not describe all possible interactions. Give your health care provider a list of all the medicines, herbs, non-prescription drugs, or dietary supplements you use. Also tell them if you smoke, drink alcohol, or use illegal drugs. Some items may interact with your medicine. What should I watch for while using this medicine? Tell your doctor if your symptoms do not get better or if they get worse. Visit your doctor or health care professional for regular checks on your progress. Because it may take several weeks to see the full effects of this medicine, it is important to continue your treatment as prescribed by your doctor. Patients and their families should watch out for new or worsening thoughts of suicide or depression. Also watch out for sudden changes in feelings such as feeling anxious, agitated, panicky, irritable, hostile, aggressive,  impulsive, severely restless, overly excited and hyperactive, or not being able to sleep. If this happens, especially at the beginning of treatment or after a change in dose, call your health care professional. Bonita Quin may get drowsy or dizzy. Do not drive, use machinery, or do anything that needs mental alertness until you know  how this medicine affects you. Do not stand or sit up quickly, especially if you are an older patient. This reduces the risk of dizzy or fainting spells. Alcohol may interfere with the effect of this medicine. Avoid alcoholic drinks. Do not treat yourself for coughs, colds, or allergies without asking your doctor or health care professional for advice. Some ingredients can increase possible side effects. Your mouth may get dry. Chewing sugarless gum or sucking hard candy, and drinking plenty of water will help. Contact your doctor if the problem does not go away or is severe. This medicine may cause dry eyes and blurred vision. If you wear contact lenses you may feel some discomfort. Lubricating drops may help. See your eye doctor if the problem does not go away or is severe. This medicine can cause constipation. Try to have a bowel movement at least every 2 to 3 days. If you do not have a bowel movement for 3 days, call your doctor or health care professional. This medicine can make you more sensitive to the sun. Keep out of the sun. If you cannot avoid being in the sun, wear protective clothing and use sunscreen. Do not use sun lamps or tanning beds/booths. What side effects may I notice from receiving this medicine? Side effects that you should report to your doctor or health care professional as soon as possible: -allergic reactions like skin rash, itching or hives, swelling of the face, lips, or tongue -anxious -breathing problems -changes in vision -confusion -elevated mood, decreased need for sleep, racing thoughts, impulsive behavior -eye pain -fast, irregular  heartbeat -feeling faint or lightheaded, falls -feeling agitated, angry, or irritable -fever with increased sweating -hallucination, loss of contact with reality -seizures -stiff muscles -suicidal thoughts or other mood changes -tingling, pain, or numbness in the feet or hands -trouble passing urine or change in the amount of urine -trouble sleeping -unusually weak or tired -vomiting -yellowing of the eyes or skin Side effects that usually do not require medical attention (report to your doctor or health care professional if they continue or are bothersome): -change in sex drive or performance -change in appetite or weight -constipation -dizziness -dry mouth -nausea -tired -tremors -upset stomach This list may not describe all possible side effects. Call your doctor for medical advice about side effects. You may report side effects to FDA at 1-800-FDA-1088. Where should I keep my medicine? Keep out of the reach of children. Store at room temperature between 20 and 25 degrees C (68 and 77 degrees F). Throw away any unused medicine after the expiration date. NOTE: This sheet is a summary. It may not cover all possible information. If you have questions about this medicine, talk to your doctor, pharmacist, or health care provider.  2018 Elsevier/Gold Standard (2015-08-03 12:14:15)
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
## 2017-08-18 ENCOUNTER — Telehealth: Payer: Self-pay | Admitting: Neurology

## 2017-08-18 NOTE — Telephone Encounter (Signed)
MR Brain w/wo contrast [facility]hern [facility]: [id number]: [id number] (exp. 08/17/17 to 10/01/17). Patient is scheduled for 09/01/17 on the [facility] mobile unit.

## 2017-08-25 ENCOUNTER — Other Ambulatory Visit: Payer: 59

## 2017-08-31 ENCOUNTER — Telehealth: Payer: Self-pay | Admitting: Neurology

## 2017-08-31 NOTE — Telephone Encounter (Signed)
I called and new prescription into to [facility] for the patients Botox. The pharmacist stated it would take 3-5 days to pend.

## 2017-09-01 ENCOUNTER — Ambulatory Visit: Payer: 59

## 2017-09-01 DIAGNOSIS — R51 Headache: Secondary | ICD-10-CM

## 2017-09-01 DIAGNOSIS — R519 Headache, unspecified: Secondary | ICD-10-CM

## 2017-09-01 MED ORDER — GADOPENTETATE DIMEGLUMINE 469.01 MG/ML IV SOLN
19.0000 mL | Freq: Once | INTRAVENOUS | Status: AC | PRN
  Administered 2017-09-01: 19 mL via INTRAVENOUS

## 2017-09-07 ENCOUNTER — Telehealth: Payer: Self-pay | Admitting: *Deleted

## 2017-09-07 NOTE — Telephone Encounter (Signed)
Called pt and informed her of normal MRI brain results. She verbalized understanding and appreciation.

## 2017-09-07 NOTE — Telephone Encounter (Signed)
-----   Message from Anson FretAntonia B Ahern, MD sent at 09/07/2017 10:10 AM EDT ----- Normal MRi brain

## 2017-09-08 NOTE — Telephone Encounter (Signed)
Patient was denied through pharmacy benefits. She is not covered for Botox through her pharmacy benefits, her plan has an exclusion. The pharmacy told me I needed to call and see if she was covered through her medical benefits. I called the patient's insurance and spoke with Kathlene NovemberMike who told me the medication is not eligible for B/B.   I called the patient and made her aware, we canceled her apt. She would like to know different treatment options. Please call and advise.

## 2017-09-08 NOTE — Telephone Encounter (Signed)
I called to check status of the patient's Botox medication. It was still pending insurance verification. PA was needed so spoke with someone in the PA department and initiated one.

## 2017-09-09 NOTE — Telephone Encounter (Signed)
Yes, aimovig 140mg  qmonth. thanks

## 2017-09-10 ENCOUNTER — Telehealth: Payer: Self-pay

## 2017-09-10 ENCOUNTER — Telehealth: Payer: Self-pay | Admitting: Neurology

## 2017-09-10 MED ORDER — ERENUMAB-AOOE 140 MG/ML ~~LOC~~ SOAJ: 140.0000 mg | SUBCUTANEOUS | 11 refills | Status: DC

## 2017-09-10 NOTE — Telephone Encounter (Signed)
Request Reference Number: [id number]. AIMOVIG INJ 140MG /ML is approved through 12/11/2017

## 2017-09-10 NOTE — Addendum Note (Signed)
Addended by: Bertram SavinULBERTSON, Erandi Lemma L on: 09/10/2017 10:01 AM   Modules accepted: Orders

## 2017-09-10 NOTE — Telephone Encounter (Signed)
Spoke with pt. Discussed that per Danielle's notes, pt is not eligible for Botox per insurance and is not eligible for pharmacy or B/B options. She verbalized understanding but did have further questions that would best be answered by Duwayne Heckanielle and she has requested a call back. We did discuss that in the last office visit Dr. Lucia GaskinsAhern had discussed Aimovig and given her information on this. Per Dr. Lucia GaskinsAhern, we can start Aimovig now 140 mg monthly injection into the skin with an autoinjector. The patient verbalized understanding and would like to start it. She stated she had already downloaded the copay card and is aware this will either be free or $5 monthly. I confirmed she has no allergy to latex. Discussed that medication should remain refrigerated until ready for use, then let sit at room temp 30 minutes prior to administration. She was advised that detailed instructions would be provided with the medication and was encouraged to read these and call office, pharmacy, or Aimovig with any questions. Also scheduled a f/u appt 01/04/18 @ 1:15 arrival time 12:45 with Eber JonesCarolyn and canceled the August appt as pt will need a few months to see how Aimovig helps (per MD). Pt verbalized understanding.

## 2017-09-10 NOTE — Telephone Encounter (Signed)
Aimovig PA completed, sent to [facility]. Should have a determination in 3-5 business days.

## 2017-09-10 NOTE — Telephone Encounter (Signed)
Called pt & LVM (ok per DPR) advising her that the next treatment option per Dr. Lucia GaskinsAhern would be Aimovig as discussed previously. Asked for a call back to discuss and then we can order this for her. Left office number in message.   Will confirm pt does not have latex allergy.

## 2017-09-10 NOTE — Telephone Encounter (Signed)
Pt called unclear why her botox appt had been canceled once advised that her insurance will not cover medication pt wanted to schedule an appt to see Dr. Lucia GaskinsAhern this way they could discuss her options moving forward. Advised pt I would schedule appt and send a tele note back to the RN this way she could know more prior to the appt, pt stated "the RN wouldn't know I need to speak with Dr. Lucia GaskinsAhern." Please call to advise.

## 2017-09-11 NOTE — Telephone Encounter (Signed)
[facility]. They are aware of the Aimovig 140 mg approval. She stated pt's copay without the savings card is $115. Pt was previously given info for copay card.

## 2017-09-14 NOTE — Telephone Encounter (Signed)
[facility] and spoke with Theodoro DoingKrysta in the phone room, needed clarification on the medical notes sent over. The patient cannot be approved for botox because she is currently receiving aimovig.

## 2017-09-14 NOTE — Telephone Encounter (Signed)
Samantha [facility]strada [facility] called and made me aware that Samantha Estrada had filed to try and get coverage through medical benefits after denial of medication through pharmacy benefits. I told her I was under the understanding that [facility] not [facility] and she said there was a chance it could be filled through Samantha Estrada and then filed to the patients medical benefits. She requested I fax clinicals over so I did so.   I called the patient to discuss but she did not answer so I left a VM asking her to call me back.

## 2017-09-14 NOTE — Telephone Encounter (Signed)
Let patient know thanks 

## 2017-09-15 NOTE — Telephone Encounter (Signed)
Called patient left her a message asking her to call back to schedule Botox Apt.

## 2017-09-15 NOTE — Telephone Encounter (Signed)
Ron A. Botox TBD 09/16/2017 . [facility]

## 2017-09-15 NOTE — Telephone Encounter (Signed)
Pt called stating that she missed a call, pt is asking for a call back

## 2017-09-15 NOTE — Telephone Encounter (Signed)
° °  Per Rep Botox is approved Irwin BrakemanMandy M  # [id number]  09/01/2017 -09/01/2018  [facility] - [phone]

## 2017-09-16 NOTE — Telephone Encounter (Signed)
Noted, thank you

## 2017-09-22 ENCOUNTER — Ambulatory Visit: Payer: 59 | Admitting: Neurology

## 2017-09-24 NOTE — Telephone Encounter (Signed)
I called to schedule patient for botox but she did not answer so I left a VM asking her to call me back.

## 2017-09-24 NOTE — Telephone Encounter (Signed)
Pt called to schedule botox. She can be reached at [phone]

## 2017-10-02 ENCOUNTER — Ambulatory Visit (INDEPENDENT_AMBULATORY_CARE_PROVIDER_SITE_OTHER): Payer: 59 | Admitting: Neurology

## 2017-10-02 ENCOUNTER — Telehealth: Payer: Self-pay | Admitting: Neurology

## 2017-10-02 DIAGNOSIS — G43711 Chronic migraine without aura, intractable, with status migrainosus: Secondary | ICD-10-CM

## 2017-10-02 NOTE — Telephone Encounter (Signed)
I made her botox follow up for oct 11.. I think that was 12 weeks.

## 2017-10-02 NOTE — Progress Notes (Signed)
Botox- 100 units x 2 vials Lot: [id number] Expiration: 03/2020 NDC: 0023-1145-01  Bacteriostatic 0.9% Sodium Chloride- 4mL total Lot: [id number] Expiration: 07/16/2018 NDC: 0409-1966-02  Dx: G43.711 S/P  

## 2017-10-02 NOTE — Progress Notes (Signed)
Consent Form Botulism Toxin Injection For Chronic Migraine  First injection. She worries about decreased facial expressions, so did the injections high in the forehead, did temples, no other places.   Reviewed orally with patient, additionally signature is on file:  Botulism toxin has been approved by the Federal drug administration for treatment of chronic migraine. Botulism toxin does not cure chronic migraine and it may not be effective in some patients.  The administration of botulism toxin is accomplished by injecting a small amount of toxin into the muscles of the neck and head. Dosage must be titrated for each individual. Any benefits resulting from botulism toxin tend to wear off after 3 months with a repeat injection required if benefit is to be maintained. Injections are usually done every 3-4 months with maximum effect peak achieved by about 2 or 3 weeks. Botulism toxin is expensive and you should be sure of what costs you will incur resulting from the injection.  The side effects of botulism toxin use for chronic migraine may include:   -Transient, and usually mild, facial weakness with facial injections  -Transient, and usually mild, head or neck weakness with head/neck injections  -Reduction or loss of forehead facial animation due to forehead muscle weakness  -Eyelid drooping  -Dry eye  -Pain at the site of injection or bruising at the site of injection  -Double vision  -Potential unknown long term risks  Contraindications: You should not have Botox if you are pregnant, nursing, allergic to albumin, have an infection, skin condition, or muscle weakness at the site of the injection, or have myasthenia gravis, Lambert-Eaton syndrome, or ALS.  It is also possible that as with any injection, there may be an allergic reaction or no effect from the medication. Reduced effectiveness after repeated injections is sometimes seen and rarely infection at the injection site may occur.  All care will be taken to prevent these side effects. If therapy is given over a long time, atrophy and wasting in the muscle injected may occur. Occasionally the patient's become refractory to treatment because they develop antibodies to the toxin. In this event, therapy needs to be modified.  I have read the above information and consent to the administration of botulism toxin.    BOTOX PROCEDURE NOTE FOR MIGRAINE HEADACHE    Contraindications and precautions discussed with patient(above). Aseptic procedure was observed and patient tolerated procedure. Procedure performed by Dr. Artemio Alyoni Gaylin Osoria  The condition has existed for more than 6 months, and pt does not have a diagnosis of ALS, Myasthenia Gravis or Lambert-Eaton Syndrome.  Risks and benefits of injections discussed and pt agrees to proceed with the procedure.  Written consent obtained  These injections are medically necessary. Pt  receives good benefits from these injections. These injections do not cause sedations or hallucinations which the oral therapies may cause.  Indication/Diagnosis: chronic migraine BOTOX(J0585) injection was performed according to protocol by [employer]. 200 units of BOTOX was dissolved into 4 cc NS.   NDC: [address]-4400023-1145-01   Description of procedure:  The patient was placed in a sitting position. The standard protocol was used for Botox as follows, with 5 units of Botox injected at each site:   -Procerus muscle, midline injection  -Corrugator muscle, bilateral injection  -Frontalis muscle, bilateral injection, with 2 sites each side, medial injection was performed in the upper one third of the frontalis muscle, in the region vertical from the medial inferior edge of the superior orbital rim. The lateral injection was again in  the upper one third of the forehead vertically above the lateral limbus of the cornea, 1.5 cm lateral to the medial injection site.  -Temporalis muscle injection, 4 sites, bilaterally.  The first injection was 3 cm above the tragus of the ear, second injection site was 1.5 cm to 3 cm up from the first injection site in line with the tragus of the ear. The third injection site was 1.5-3 cm forward between the first 2 injection sites. The fourth injection site was 1.5 cm posterior to the second injection site.  -Occipitalis muscle injection, 3 sites, bilaterally. The first injection was done one half way between the occipital protuberance and the tip of the mastoid process behind the ear. The second injection site was done lateral and superior to the first, 1 fingerbreadth from the first injection. The third injection site was 1 fingerbreadth superiorly and medially from the first injection site.  -Cervical paraspinal muscle injection, 2 sites, bilateral knee first injection site was 1 cm from the midline of the cervical spine, 3 cm inferior to the lower border of the occipital protuberance. The second injection site was 1.5 cm superiorly and laterally to the first injection site.  -Trapezius muscle injection was performed at 3 sites, bilaterally. The first injection site was in the upper trapezius muscle halfway between the inflection point of the neck, and the acromion. The second injection site was one half way between the acromion and the first injection site. The third injection was done between the first injection site and the inflection point of the neck.   Will return for repeat injection in 3 months.   A 200 unit sof Botox was used, 155 units were injected, the rest of the Botox was wasted. The patient tolerated the procedure well, there were no complications of the above procedure.

## 2017-10-05 NOTE — Telephone Encounter (Signed)
Noted, thank you

## 2017-11-05 ENCOUNTER — Ambulatory Visit: Payer: 59 | Admitting: Neurology

## 2017-12-21 ENCOUNTER — Telehealth: Payer: Self-pay | Admitting: Neurology

## 2017-12-21 NOTE — Telephone Encounter (Signed)
I called the patient to make her aware the pharmacy is awaiting consent. She did not answer so I left a VM asking her to call me back.

## 2017-12-25 ENCOUNTER — Ambulatory Visit: Payer: 59 | Admitting: Neurology
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
## 2018-01-04 ENCOUNTER — Ambulatory Visit: Payer: 59 | Admitting: Neurology

## 2018-01-04 ENCOUNTER — Ambulatory Visit: Payer: 59 | Admitting: Nurse Practitioner

## 2018-01-05 ENCOUNTER — Encounter: Payer: Self-pay | Admitting: Nurse Practitioner

## 2018-01-06 ENCOUNTER — Ambulatory Visit (INDEPENDENT_AMBULATORY_CARE_PROVIDER_SITE_OTHER): Payer: 59 | Admitting: Neurology

## 2018-01-06 DIAGNOSIS — G43711 Chronic migraine without aura, intractable, with status migrainosus: Secondary | ICD-10-CM

## 2018-01-06 NOTE — Progress Notes (Signed)
Consent Form Botulism Toxin Injection For Chronic Migraine  Interval history 01/06/2018: Patients baseline is 15 migraine days a month and daily headaches. She now has only 2-3 headache days a month and she can't remember her last migraine. >90% improvement in frequency and severity.  First injection. She worries about decreased facial expressions, so did the injections high in the forehead, did temples, no other places.   Reviewed orally with patient, additionally signature is on file:  Botulism toxin has been approved by the Federal drug administration for treatment of chronic migraine. Botulism toxin does not cure chronic migraine and it may not be effective in some patients.  The administration of botulism toxin is accomplished by injecting a small amount of toxin into the muscles of the neck and head. Dosage must be titrated for each individual. Any benefits resulting from botulism toxin tend to wear off after 3 months with a repeat injection required if benefit is to be maintained. Injections are usually done every 3-4 months with maximum effect peak achieved by about 2 or 3 weeks. Botulism toxin is expensive and you should be sure of what costs you will incur resulting from the injection.  The side effects of botulism toxin use for chronic migraine may include:   -Transient, and usually mild, facial weakness with facial injections  -Transient, and usually mild, head or neck weakness with head/neck injections  -Reduction or loss of forehead facial animation due to forehead muscle weakness  -Eyelid drooping  -Dry eye  -Pain at the site of injection or bruising at the site of injection  -Double vision  -Potential unknown long term risks  Contraindications: You should not have Botox if you are pregnant, nursing, allergic to albumin, have an infection, skin condition, or muscle weakness at the site of the injection, or have myasthenia gravis, Lambert-Eaton syndrome, or ALS.  It is  also possible that as with any injection, there may be an allergic reaction or no effect from the medication. Reduced effectiveness after repeated injections is sometimes seen and rarely infection at the injection site may occur. All care will be taken to prevent these side effects. If therapy is given over a long time, atrophy and wasting in the muscle injected may occur. Occasionally the patient's become refractory to treatment because they develop antibodies to the toxin. In this event, therapy needs to be modified.  I have read the above information and consent to the administration of botulism toxin.    BOTOX PROCEDURE NOTE FOR MIGRAINE HEADACHE    Contraindications and precautions discussed with patient(above). Aseptic procedure was observed and patient tolerated procedure. Procedure performed by Dr. Artemio Aly  The condition has existed for more than 6 months, and pt does not have a diagnosis of ALS, Myasthenia Gravis or Lambert-Eaton Syndrome.  Risks and benefits of injections discussed and pt agrees to proceed with the procedure.  Written consent obtained  These injections are medically necessary. Pt  receives good benefits from these injections. These injections do not cause sedations or hallucinations which the oral therapies may cause.  Indication/Diagnosis: chronic migraine BOTOX(J0585) injection was performed according to protocol by [employer]. 200 units of BOTOX was dissolved into 4 cc NS.   NDC: [address]-82   Description of procedure:  The patient was placed in a sitting position. The standard protocol was used for Botox as follows, with 5 units of Botox injected at each site:   -Procerus muscle, midline injection  -Corrugator muscle, bilateral injection  -Frontalis muscle, bilateral injection, with  2 sites each side, medial injection was performed in the upper one third of the frontalis muscle, in the region vertical from the medial inferior edge of the superior  orbital rim. The lateral injection was again in the upper one third of the forehead vertically above the lateral limbus of the cornea, 1.5 cm lateral to the medial injection site.  -Temporalis muscle injection, 4 sites, bilaterally. The first injection was 3 cm above the tragus of the ear, second injection site was 1.5 cm to 3 cm up from the first injection site in line with the tragus of the ear. The third injection site was 1.5-3 cm forward between the first 2 injection sites. The fourth injection site was 1.5 cm posterior to the second injection site.  -Occipitalis muscle injection, 3 sites, bilaterally. The first injection was done one half way between the occipital protuberance and the tip of the mastoid process behind the ear. The second injection site was done lateral and superior to the first, 1 fingerbreadth from the first injection. The third injection site was 1 fingerbreadth superiorly and medially from the first injection site.  -Cervical paraspinal muscle injection, 2 sites, bilateral knee first injection site was 1 cm from the midline of the cervical spine, 3 cm inferior to the lower border of the occipital protuberance. The second injection site was 1.5 cm superiorly and laterally to the first injection site.  -Trapezius muscle injection was performed at 3 sites, bilaterally. The first injection site was in the upper trapezius muscle halfway between the inflection point of the neck, and the acromion. The second injection site was one half way between the acromion and the first injection site. The third injection was done between the first injection site and the inflection point of the neck.   Will return for repeat injection in 3 months.   A 200 unit sof Botox was used, 155 units were injected, the rest of the Botox was wasted. The patient tolerated the procedure well, there were no complications of the above procedure.

## 2018-01-06 NOTE — Progress Notes (Signed)
Botox- 100 units x 2 vials Lot: [id number] Expiration: 05/2020 NDC: 0023-1145-01  Bacteriostatic 0.9% Sodium Chloride- 4mL total Lot: [id number] Expiration: 12/16/2018 NDC: 0409-1966-02  Dx: G43.711 [facility]   

## 2018-01-08 ENCOUNTER — Ambulatory Visit: Payer: 59 | Admitting: Neurology

## 2018-01-11 ENCOUNTER — Telehealth: Payer: Self-pay | Admitting: Neurology

## 2018-01-11 NOTE — Telephone Encounter (Signed)
Pt requesting a call stating medication Erenumab-aooe (AIMOVIG) 140 MG/ML SO[facility] is needing a PA

## 2018-01-11 NOTE — Telephone Encounter (Signed)
Noted  

## 2018-01-13 NOTE — Telephone Encounter (Signed)
Called pt and LVM asking for call back.   When trying to complete PA on Cover My Meds it says that patient is not a match with date of birth and name. Confirmed information correct when compared with chart. Will continue to try again and wait for pt call back to verify if insurance has changed, etc.

## 2018-01-13 NOTE — Telephone Encounter (Signed)
Patient called in stating she would like if she could get a call to give her the status on the PA for her Aimovig.

## 2018-01-13 NOTE — Telephone Encounter (Signed)
Called [facility] to complete PA over the phone. Spoke with Nicholos Johns. Aimovig 140 mg approved 01/13/2018 through 01/14/2019. Approval letter to follow via fax.  [id number]  Per pt report, she is doing well on Aimovig, less intense and less frequent migraines and headaches.

## 2018-04-13 NOTE — Progress Notes (Signed)
Consent Form Botulism Toxin Injection For Chronic Migraine    Interval history 01/06/2018: This is our third botox. Patients baseline is 15 migraine days a month and daily headaches. She now has only 2-3 headache days a month and she can't remember her last migraine. >90% improvement in frequency and severity. She is still on aimovig.   First injection. She worries about decreased facial expressions, so did the injections high in the forehead. Not masseters or temples, did more in the left trap and LS.    Reviewed orally with patient, additionally signature is on file:  Botulism toxin has been approved by the Federal drug administration for treatment of chronic migraine. Botulism toxin does not cure chronic migraine and it may not be effective in some patients.  The administration of botulism toxin is accomplished by injecting a small amount of toxin into the muscles of the neck and head. Dosage must be titrated for each individual. Any benefits resulting from botulism toxin tend to wear off after 3 months with a repeat injection required if benefit is to be maintained. Injections are usually done every 3-4 months with maximum effect peak achieved by about 2 or 3 weeks. Botulism toxin is expensive and you should be sure of what costs you will incur resulting from the injection.  The side effects of botulism toxin use for chronic migraine may include:   -Transient, and usually mild, facial weakness with facial injections  -Transient, and usually mild, head or neck weakness with head/neck injections  -Reduction or loss of forehead facial animation due to forehead muscle weakness  -Eyelid drooping  -Dry eye  -Pain at the site of injection or bruising at the site of injection  -Double vision  -Potential unknown long term risks  Contraindications: You should not have Botox if you are pregnant, nursing, allergic to albumin, have an infection, skin condition, or muscle weakness at the site of  the injection, or have myasthenia gravis, Lambert-Eaton syndrome, or ALS.  It is also possible that as with any injection, there may be an allergic reaction or no effect from the medication. Reduced effectiveness after repeated injections is sometimes seen and rarely infection at the injection site may occur. All care will be taken to prevent these side effects. If therapy is given over a long time, atrophy and wasting in the muscle injected may occur. Occasionally the patient's become refractory to treatment because they develop antibodies to the toxin. In this event, therapy needs to be modified.  I have read the above information and consent to the administration of botulism toxin.    BOTOX PROCEDURE NOTE FOR MIGRAINE HEADACHE    Contraindications and precautions discussed with patient(above). Aseptic procedure was observed and patient tolerated procedure. Procedure performed by Dr. Artemio Aly  The condition has existed for more than 6 months, and pt does not have a diagnosis of ALS, Myasthenia Gravis or Lambert-Eaton Syndrome.  Risks and benefits of injections discussed and pt agrees to proceed with the procedure.  Written consent obtained  These injections are medically necessary. Pt  receives good benefits from these injections. These injections do not cause sedations or hallucinations which the oral therapies may cause.  Description of procedure:  The patient was placed in a sitting position. The standard protocol was used for Botox as follows, with 5 units of Botox injected at each site:   -Procerus muscle, midline injection  -Corrugator muscle, bilateral injection  -Frontalis muscle, bilateral injection, with 2 sites each side, medial injection was  performed in the upper one third of the frontalis muscle, in the region vertical from the medial inferior edge of the superior orbital rim. The lateral injection was again in the upper one third of the forehead vertically above the  lateral limbus of the cornea, 1.5 cm lateral to the medial injection site.  - Levator Scapulae: 15 units left   -Temporalis muscle injection, 5 sites, bilaterally. The first injection was 3 cm above the tragus of the ear, second injection site was 1.5 cm to 3 cm up from the first injection site in line with the tragus of the ear. The third injection site was 1.5-3 cm forward between the first 2 injection sites. The fourth injection site was 1.5 cm posterior to the second injection site.   -Occipitalis muscle injection, 3 sites, bilaterally. The first injection was done one half way between the occipital protuberance and the tip of the mastoid process behind the ear. The second injection site was done lateral and superior to the first, 1 fingerbreadth from the first injection. The third injection site was 1 fingerbreadth superiorly and medially from the first injection site.  -Cervical paraspinal muscle injection, 2 sites, bilateral knee first injection site was 1 cm from the midline of the cervical spine, 3 cm inferior to the lower border of the occipital protuberance. The second injection site was 1.5 cm superiorly and laterally to the first injection site.  -Trapezius muscle injection was performed at 3 sites, bilaterally. The first injection site was in the upper trapezius muscle halfway between the inflection point of the neck, and the acromion. The second injection site was one half way between the acromion and the first injection site. The third injection was done between the first injection site and the inflection point of the neck. Additional 15 units in the left trapezius based on palpation and pain.    Will return for repeat injection in 3 months.   A 200 unit sof Botox was used, any Botox not injected was wasted. The patient tolerated the procedure well, there were no complications of the above procedure.

## 2018-04-14 ENCOUNTER — Ambulatory Visit (INDEPENDENT_AMBULATORY_CARE_PROVIDER_SITE_OTHER): Payer: 59 | Admitting: Neurology

## 2018-04-14 DIAGNOSIS — G43711 Chronic migraine without aura, intractable, with status migrainosus: Secondary | ICD-10-CM | POA: Diagnosis not present

## 2018-04-14 NOTE — Progress Notes (Signed)
Botox- 100 units x 2 vials Lot: [id number] Expiration: 07/2020 NDC: 0023-1145-01  Bacteriostatic 0.9% Sodium Chloride- 4mL total Lot: [id number] Expiration: 12/16/2018 NDC: 0409-1966-02  Dx: [id number] S/P  

## 2018-06-02 ENCOUNTER — Telehealth: Payer: Self-pay | Admitting: Neurology

## 2018-06-02 NOTE — Telephone Encounter (Signed)
Pt is asking for a call re: the billing of he Botox medication.  Pt was advised that the Botox coordinator is out of office until Monday of next week and given an option to wait to hear from her next week.  Pt states she would like to hear from someone this week on this matter.  Please call

## 2018-06-03 NOTE — Telephone Encounter (Signed)
Called [facility] to check on patient's Botox and they relayed they could not speak to me about patient's balance . I did a three way call and patient could not pay her balance at this time and she will call use back.

## 2018-06-07 NOTE — Telephone Encounter (Signed)
Noted, thank you. DW  °

## 2018-06-07 NOTE — Telephone Encounter (Signed)
Patient called and scheduled an apt and stated her medication was ready at [facility] rx. I called [facility] to schedule delivery. We schedule delivery for 06/09/18. DW

## 2018-07-02 ENCOUNTER — Encounter

## 2018-07-02 ENCOUNTER — Ambulatory Visit: Payer: 59 | Admitting: Neurology

## 2018-07-13 ENCOUNTER — Telehealth: Payer: Self-pay | Admitting: Neurology

## 2018-07-13 NOTE — Telephone Encounter (Signed)
I called and spoke with the patient regarding changing her apt due to COVID-19. I confirmed that she has not shown any new symptoms, been exposed to the virus nor been running a fever. I also informed her she would need to wear a mask and gloves. We went ahead and scheduled her 3 month follow up as well.  °

## 2018-07-27 ENCOUNTER — Other Ambulatory Visit: Payer: Self-pay

## 2018-07-27 ENCOUNTER — Ambulatory Visit (INDEPENDENT_AMBULATORY_CARE_PROVIDER_SITE_OTHER): Payer: 59 | Admitting: Neurology

## 2018-07-27 VITALS — Temp 97.1°F

## 2018-07-27 DIAGNOSIS — M7918 Myalgia, other site: Secondary | ICD-10-CM

## 2018-07-27 DIAGNOSIS — G43711 Chronic migraine without aura, intractable, with status migrainosus: Secondary | ICD-10-CM

## 2018-07-27 MED ORDER — RANITIDINE HCL 300 MG PO TABS: 300.0000 mg | ORAL_TABLET | Freq: Every day | ORAL | 3 refills | Status: DC

## 2018-07-27 NOTE — Progress Notes (Signed)
Botox- 100 units x 2 vials Lot: [id number] Expiration: 11/2020 NDC: 0023-1145-01  Bacteriostatic 0.9% Sodium Chloride- 4mL total Lot: [id number] Expiration: 12/16/2018 NDC: 0409-1966-02  Dx: [id number] S/P   

## 2018-07-27 NOTE — Progress Notes (Signed)
Consent Form Botulism Toxin Injection For Chronic Migraine    Interval history 07/27/2018: This is our 4thbotox.This has been delayed due to covid and she feels her migraines are returning, she loves botox.  Patients baseline is 15 migraine days a month and daily headaches. She now has only 2-3 headache days a month and she can't remember her last migraine. >90% improvement in frequency and severity. She is still on aimovig.   She worries about decreased facial expressions, so did the injections high in the forehead. Not masseters or temples, did more in the left trap and LS .   DRY NEEDLING: She has significant tightness in the left trap and cervical muscles withh order dry needling on [employer].  Orders Placed This Encounter  Procedures  . Ambulatory referral to Physical Therapy     Reviewed orally with patient, additionally signature is on file:  Botulism toxin has been approved by the Federal drug administration for treatment of chronic migraine. Botulism toxin does not cure chronic migraine and it may not be effective in some patients.  The administration of botulism toxin is accomplished by injecting a small amount of toxin into the muscles of the neck and head. Dosage must be titrated for each individual. Any benefits resulting from botulism toxin tend to wear off after 3 months with a repeat injection required if benefit is to be maintained. Injections are usually done every 3-4 months with maximum effect peak achieved by about 2 or 3 weeks. Botulism toxin is expensive and you should be sure of what costs you will incur resulting from the injection.  The side effects of botulism toxin use for chronic migraine may include:   -Transient, and usually mild, facial weakness with facial injections  -Transient, and usually mild, head or neck weakness with head/neck injections  -Reduction or loss of forehead facial animation due to forehead muscle weakness  -Eyelid drooping  -Dry  eye  -Pain at the site of injection or bruising at the site of injection  -Double vision  -Potential unknown long term risks  Contraindications: You should not have Botox if you are pregnant, nursing, allergic to albumin, have an infection, skin condition, or muscle weakness at the site of the injection, or have myasthenia gravis, Lambert-Eaton syndrome, or ALS.  It is also possible that as with any injection, there may be an allergic reaction or no effect from the medication. Reduced effectiveness after repeated injections is sometimes seen and rarely infection at the injection site may occur. All care will be taken to prevent these side effects. If therapy is given over a long time, atrophy and wasting in the muscle injected may occur. Occasionally the patient's become refractory to treatment because they develop antibodies to the toxin. In this event, therapy needs to be modified.  I have read the above information and consent to the administration of botulism toxin.    BOTOX PROCEDURE NOTE FOR MIGRAINE HEADACHE    Contraindications and precautions discussed with patient(above). Aseptic procedure was observed and patient tolerated procedure. Procedure performed by Dr. Artemio Aly  The condition has existed for more than 6 months, and pt does not have a diagnosis of ALS, Myasthenia Gravis or Lambert-Eaton Syndrome.  Risks and benefits of injections discussed and pt agrees to proceed with the procedure.  Written consent obtained  These injections are medically necessary. Pt  receives good benefits from these injections. These injections do not cause sedations or hallucinations which the oral therapies may cause.  Description of  procedure:  The patient was placed in a sitting position. The standard protocol was used for Botox as follows, with 5 units of Botox injected at each site:   -Procerus muscle, midline injection  -Corrugator muscle, bilateral injection  -Frontalis muscle,  bilateral injection, with 2 sites each side, medial injection was performed in the upper one third of the frontalis muscle, in the region vertical from the medial inferior edge of the superior orbital rim. The lateral injection was again in the upper one third of the forehead vertically above the lateral limbus of the cornea, 1.5 cm lateral to the medial injection site.  - Levator Scapulae: 15 units left   -Temporalis muscle injection, 5 sites, bilaterally. The first injection was 3 cm above the tragus of the ear, second injection site was 1.5 cm to 3 cm up from the first injection site in line with the tragus of the ear. The third injection site was 1.5-3 cm forward between the first 2 injection sites. The fourth injection site was 1.5 cm posterior to the second injection site.   -Occipitalis muscle injection, 3 sites, bilaterally. The first injection was done one half way between the occipital protuberance and the tip of the mastoid process behind the ear. The second injection site was done lateral and superior to the first, 1 fingerbreadth from the first injection. The third injection site was 1 fingerbreadth superiorly and medially from the first injection site.  -Cervical paraspinal muscle injection, 2 sites, bilateral knee first injection site was 1 cm from the midline of the cervical spine, 3 cm inferior to the lower border of the occipital protuberance. The second injection site was 1.5 cm superiorly and laterally to the first injection site.  -Trapezius muscle injection was performed at 3 sites, bilaterally. The first injection site was in the upper trapezius muscle halfway between the inflection point of the neck, and the acromion. The second injection site was one half way between the acromion and the first injection site. The third injection was done between the first injection site and the inflection point of the neck. Additional 15 units in the left trapezius based on palpation and pain.     Will return for repeat injection in 3 months.   A 200 unit sof Botox was used, any Botox not injected was wasted. The patient tolerated the procedure well, there were no complications of the above procedure.

## 2018-07-29 ENCOUNTER — Telehealth: Payer: Self-pay | Admitting: Neurology

## 2018-07-29 NOTE — Telephone Encounter (Signed)
Spoke with patient and discussed Dr. Trevor Mace message. The patient verbalized understanding and appreciation.

## 2018-07-29 NOTE — Telephone Encounter (Signed)
Pt states when she went to the pharmacy they informed her that there was a recall on the ranitidine (ZANTAC) 300 MG tablet and she would like to know what she can take in the place of it. Please advise.

## 2018-07-29 NOTE — Telephone Encounter (Signed)
I let patient know that I don;t really prescribe these meds. I refilled it for her since it was already on her list but I am not knowledgeable with GI meds, not my specialty. She can try something over the counter like prilosec and discuss when she gets a pcp but prilosec seems to work well.

## 2018-08-17 ENCOUNTER — Other Ambulatory Visit: Payer: Self-pay | Admitting: Neurology

## 2018-10-21 ENCOUNTER — Telehealth: Payer: Self-pay | Admitting: Neurology

## 2018-10-21 NOTE — Telephone Encounter (Signed)
Medication will need to be scheduled from [facility] at [phone].

## 2018-10-21 NOTE — Telephone Encounter (Signed)
I spoke to Samantha Estrada at [facility] she stated the medication has expired I spoke to [facility] the Pharmacist and she started a new refill. She stated she was going to do refills every 90 days for the rest of the year. Right now it is pending.

## 2018-10-25 NOTE — Telephone Encounter (Signed)
I spoke to Jenny Reichmann at [facility] and the patient needs to give consent. He tried to reach out to the patient but her vmail was not set up.

## 2018-10-25 NOTE — Telephone Encounter (Signed)
I spoke to the patient and informed her Samantha Estrada has been trying to contact her for her to give consent to ship the botox out. She stated she will give them a call and she had their number.

## 2018-10-26 NOTE — Telephone Encounter (Signed)
Pt is asking for a call back from [facility] re: her Botox

## 2018-10-26 NOTE — Telephone Encounter (Signed)
I spoke to the patient she informed me that she gave consent.. I called [facility] and it is TBD on 10/27/18.

## 2018-10-28 ENCOUNTER — Ambulatory Visit (INDEPENDENT_AMBULATORY_CARE_PROVIDER_SITE_OTHER): Payer: 59 | Admitting: Neurology

## 2018-10-28 ENCOUNTER — Other Ambulatory Visit: Payer: Self-pay

## 2018-10-28 DIAGNOSIS — G43711 Chronic migraine without aura, intractable, with status migrainosus: Secondary | ICD-10-CM

## 2018-10-28 MED ORDER — KETOROLAC TROMETHAMINE 60 MG/2ML IM SOLN
60.0000 mg | Freq: Once | INTRAMUSCULAR | Status: AC
  Administered 2018-10-28: 60 mg via INTRAMUSCULAR

## 2018-10-28 MED ORDER — RIZATRIPTAN BENZOATE 10 MG PO TBDP: 10.0000 mg | ORAL_TABLET | ORAL | 11 refills | Status: DC | PRN

## 2018-10-28 NOTE — Progress Notes (Signed)
Botox- 100 units x 2 vials Lot: [id number] Expiration: 05/2021 NDC: 0023-1145-01  Bacteriostatic 0.9% Sodium Chloride- 4mL total Lot: [id number] Expiration: 12/16/2018 NDC: 0409-1966-02  Dx: [id number] S/P  

## 2018-10-28 NOTE — Progress Notes (Signed)
Consent Form Botulism Toxin Injection For Chronic Migraine    Interval history 07/27/2018: This is our 5th botox.we were delayed last botox due to covid and she feels better being on track, he headaches some back at the very end of the botox cycle when the botox is wearing off. She loves botox.  Patients baseline is 15 migraine days a month and daily headaches. She now has only 2-3 headache days a month and she can't remember her last migraine. >90% improvement in frequency and severity. She is still on aimovig.   She worries about decreased facial expressions, so did the injections high in the forehead. Not masseters or temples, did more in the left trap and LS .   DRY NEEDLING: She has significant tightness in the left trap and cervical muscles withh order dry needling on [address]. Ordered at last appointment she has not had it yet.    For acute: Rizatriptan(Maxalt): Please take one tablet at the onset of your headache. If it does not improve the symptoms please take one additional tablet. Do not take more then 2 tablets in 24hrs. Do not take use more then 2 to 3 times in a week.   You can take the medication above with tylenol, ibuprofen, excedrin, and/or phenergan(pronethazine) for nausea  Meds ordered this encounter  Medications  . rizatriptan (MAXALT-MLT) 10 MG disintegrating tablet    Sig: Take 1 tablet (10 mg total) by mouth as needed for migraine. May repeat in 2 hours if needed    Dispense:  9 tablet    Refill:  11     Reviewed orally with patient, additionally signature is on file:  Botulism toxin has been approved by the Federal drug administration for treatment of chronic migraine. Botulism toxin does not cure chronic migraine and it may not be effective in some patients.  The administration of botulism toxin is accomplished by injecting a small amount of toxin into the muscles of the neck and head. Dosage must be titrated for each individual. Any benefits resulting  from botulism toxin tend to wear off after 3 months with a repeat injection required if benefit is to be maintained. Injections are usually done every 3-4 months with maximum effect peak achieved by about 2 or 3 weeks. Botulism toxin is expensive and you should be sure of what costs you will incur resulting from the injection.  The side effects of botulism toxin use for chronic migraine may include:   -Transient, and usually mild, facial weakness with facial injections  -Transient, and usually mild, head or neck weakness with head/neck injections  -Reduction or loss of forehead facial animation due to forehead muscle weakness  -Eyelid drooping  -Dry eye  -Pain at the site of injection or bruising at the site of injection  -Double vision  -Potential unknown long term risks  Contraindications: You should not have Botox if you are pregnant, nursing, allergic to albumin, have an infection, skin condition, or muscle weakness at the site of the injection, or have myasthenia gravis, Lambert-Eaton syndrome, or ALS.  It is also possible that as with any injection, there may be an allergic reaction or no effect from the medication. Reduced effectiveness after repeated injections is sometimes seen and rarely infection at the injection site may occur. All care will be taken to prevent these side effects. If therapy is given over a long time, atrophy and wasting in the muscle injected may occur. Occasionally the patient's become refractory to treatment because they develop  antibodies to the toxin. In this event, therapy needs to be modified.  I have read the above information and consent to the administration of botulism toxin.    BOTOX PROCEDURE NOTE FOR MIGRAINE HEADACHE    Contraindications and precautions discussed with patient(above). Aseptic procedure was observed and patient tolerated procedure. Procedure performed by Dr. Georgia Dom  The condition has existed for more than 6 months, and pt does  not have a diagnosis of ALS, Myasthenia Gravis or Lambert-Eaton Syndrome.  Risks and benefits of injections discussed and pt agrees to proceed with the procedure.  Written consent obtained  These injections are medically necessary. Pt  receives good benefits from these injections. These injections do not cause sedations or hallucinations which the oral therapies may cause.  Description of procedure:  The patient was placed in a sitting position. The standard protocol was used for Botox as follows, with 5 units of Botox injected at each site:   -Procerus muscle, midline injection  -Corrugator muscle, bilateral injection  -Frontalis muscle, bilateral injection, with 2 sites each side, medial injection was performed in the upper one third of the frontalis muscle, in the region vertical from the medial inferior edge of the superior orbital rim. The lateral injection was again in the upper one third of the forehead vertically above the lateral limbus of the cornea, 1.5 cm lateral to the medial injection site.  - Levator Scapulae: 15 units left   -Temporalis muscle injection, 5 sites, bilaterally. The first injection was 3 cm above the tragus of the ear, second injection site was 1.5 cm to 3 cm up from the first injection site in line with the tragus of the ear. The third injection site was 1.5-3 cm forward between the first 2 injection sites. The fourth injection site was 1.5 cm posterior to the second injection site.   -Occipitalis muscle injection, 3 sites, bilaterally. The first injection was done one half way between the occipital protuberance and the tip of the mastoid process behind the ear. The second injection site was done lateral and superior to the first, 1 fingerbreadth from the first injection. The third injection site was 1 fingerbreadth superiorly and medially from the first injection site.  -Cervical paraspinal muscle injection, 2 sites, bilateral knee first injection site was 1 cm  from the midline of the cervical spine, 3 cm inferior to the lower border of the occipital protuberance. The second injection site was 1.5 cm superiorly and laterally to the first injection site.  -Trapezius muscle injection was performed at 3 sites, bilaterally. The first injection site was in the upper trapezius muscle halfway between the inflection point of the neck, and the acromion. The second injection site was one half way between the acromion and the first injection site. The third injection was done between the first injection site and the inflection point of the neck. Additional 15 units in the left trapezius based on palpation and pain.    Will return for repeat injection in 3 months.   A 200 unit sof Botox was used, any Botox not injected was wasted. The patient tolerated the procedure well, there were no complications of the above procedure.

## 2018-10-28 NOTE — Patient Instructions (Signed)
Rizatriptan(Maxalt): Please take one tablet at the onset of your headache. If it does not improve the symptoms please take one additional tablet. Do not take more then 2 tablets in 24hrs. Do not take use more then 2 to 3 times in a week.   You can take the medication above with tylenol, ibuprofen, excedrin, and/or phenergan(pronethazine) for nausea  Rizatriptan disintegrating tablets What is this medicine? RIZATRIPTAN (rye za TRIP tan) is used to treat migraines with or without aura. An aura is a strange feeling or visual disturbance that warns you of an attack. It is not used to prevent migraines. This medicine may be used for other purposes; ask your health care provider or pharmacist if you have questions. COMMON BRAND NAME(S): Maxalt-MLT What should I tell my health care provider before I take this medicine? They need to know if you have any of these conditions:  cigarette smoker  circulation problems in fingers and toes  diabetes  heart disease  high blood pressure  high cholesterol  history of irregular heartbeat  history of stroke  kidney disease  liver disease  stomach or intestine problems  an unusual or allergic reaction to rizatriptan, other medicines, foods, dyes, or preservatives  pregnant or trying to get pregnant  breast-feeding How should I use this medicine? Take this medicine by mouth. Follow the directions on the prescription label. Leave the tablet in the sealed blister pack until you are ready to take it. With dry hands, open the blister and gently remove the tablet. If the tablet breaks or crumbles, throw it away and take a new tablet out of the blister pack. Place the tablet in the mouth and allow it to dissolve, and then swallow. Do not cut, crush, or chew this medicine. You do not need water to take this medicine. Do not take it more often than directed. Talk to your pediatrician regarding the use of this medicine in children. While this drug may be  prescribed for children as young as 6 years for selected conditions, precautions do apply. Overdosage: If you think you have taken too much of this medicine contact a poison control center or emergency room at once. NOTE: This medicine is only for you. Do not share this medicine with others. What if I miss a dose? This does not apply. This medicine is not for regular use. What may interact with this medicine? Do not take this medicine with any of the following medicines:  certain medicines for migraine headache like almotriptan, eletriptan, frovatriptan, naratriptan, rizatriptan, sumatriptan, zolmitriptan  ergot alkaloids like dihydroergotamine, ergonovine, ergotamine, methylergonovine  MAOIs like Carbex, Eldepryl, Marplan, Nardil, and Parnate This medicine may also interact with the following medications:  certain medicines for depression, anxiety, or psychotic disorders  propranolol This list may not describe all possible interactions. Give your health care provider a list of all the medicines, herbs, non-prescription drugs, or dietary supplements you use. Also tell them if you smoke, drink alcohol, or use illegal drugs. Some items may interact with your medicine. What should I watch for while using this medicine? Visit your healthcare professional for regular checks on your progress. Tell your healthcare professional if your symptoms do not start to get better or if they get worse. You may get drowsy or dizzy. Do not drive, use machinery, or do anything that needs mental alertness until you know how this medicine affects you. Do not stand up or sit up quickly, especially if you are an older patient. This reduces the risk  of dizzy or fainting spells. Alcohol may interfere with the effect of this medicine. Your mouth may get dry. Chewing sugarless gum or sucking hard candy and drinking plenty of water may help. Contact your healthcare professional if the problem does not go away or is  severe. If you take migraine medicines for 10 or more days a month, your migraines may get worse. Keep a diary of headache days and medicine use. Contact your healthcare professional if your migraine attacks occur more frequently. What side effects may I notice from receiving this medicine? Side effects that you should report to your doctor or health care professional as soon as possible:  allergic reactions like skin rash, itching or hives, swelling of the face, lips, or tongue  chest pain or chest tightness  signs and symptoms of a dangerous change in heartbeat or heart rhythm like chest pain; dizziness; fast, irregular heartbeat; palpitations; feeling faint or lightheaded; falls; breathing problems  signs and symptoms of a stroke like changes in vision; confusion; trouble speaking or understanding; severe headaches; sudden numbness or weakness of the face, arm or leg; trouble walking; dizziness; loss of balance or coordination  signs and symptoms of serotonin syndrome like irritable; confusion; diarrhea; fast or irregular heartbeat; muscle twitching; stiff muscles; trouble walking; sweating; high fever; seizures; chills; vomiting Side effects that usually do not require medical attention (report to your doctor or health care professional if they continue or are bothersome):  diarrhea  dizziness  drowsiness  dry mouth  headache  nausea, vomiting  pain, tingling, numbness in the hands or feet  stomach pain This list may not describe all possible side effects. Call your doctor for medical advice about side effects. You may report side effects to FDA at 1-800-FDA-1088. Where should I keep my medicine? Keep out of the reach of children. Store at room temperature between 15 and 30 degrees C (59 and 86 degrees F). Protect from light and moisture. Throw away any unused medicine after the expiration date. NOTE: This sheet is a summary. It may not cover all possible information. If you  have questions about this medicine, talk to your doctor, pharmacist, or health care provider.  2020 Elsevier/Gold Standard (2017-09-15 14:58:08)

## 2018-10-28 NOTE — Addendum Note (Signed)
Addended by: Gildardo Griffes on: 10/28/2018 02:59 PM   Modules accepted: Orders

## 2019-01-26 ENCOUNTER — Telehealth: Payer: Self-pay

## 2019-01-26 NOTE — Telephone Encounter (Signed)
I spoke with the pharmacy who is awaiting patient payment. They called the patient who stated she was going to call them back with payment and wasn't sure if she had the money to make a payment on the balance she owes them. DW

## 2019-01-27 ENCOUNTER — Telehealth: Payer: Self-pay

## 2019-01-27 NOTE — Telephone Encounter (Signed)
I tried to do PA on cover my meds for Aimovig. Unable to do message stated pts demographic information is not matching.Will call [phone] to complete the rest of the PA.

## 2019-02-01 ENCOUNTER — Ambulatory Visit: Payer: 59 | Admitting: Neurology

## 2019-02-01 ENCOUNTER — Telehealth: Payer: Self-pay | Admitting: Neurology

## 2019-02-01 NOTE — Telephone Encounter (Signed)
Called [facility] and spoke with Raquel Sarna in the pharmacy. She will start a PA through [facility] and fax to [place].

## 2019-02-01 NOTE — Telephone Encounter (Signed)
Pt called and stated that the pharmacy has faxed over a request for updated information on the pt so that they can fill her AIMOVIG 140 MG/ML SOAJ at the [facility]. Please advise.

## 2019-02-01 NOTE — Telephone Encounter (Signed)
Spoke with pt this afternoon. She stated there have been no changes to her insurance. Still unable to complete PA on CMM.  I called [facility] PA dept and spoke with Jennet Maduro, provider service advocate. I was transferred to the correct department and was told to call back later because of technical issues.

## 2019-02-02 NOTE — Telephone Encounter (Signed)
[facility] is reviewing your PA request. Typically an electronic response will be received within 72 hours. To check for an update later, open this request from your dashboard.    You may close this dialog and return to your dashboard to perform other tasks.

## 2019-02-02 NOTE — Telephone Encounter (Signed)
l called pt about her PA cannot be submitted because it saying cannot find matching patient with Name and Date of Birth provided. Pt verified her name, birth date, address, and insurance information. Pt has not had a name change. I stated [facility] will be notified to do a paper PA. Pt verbalized understanding.

## 2019-02-07 NOTE — Telephone Encounter (Signed)
Received fax from [place]. Aimovig 140 mg has been approved through 02/02/2020. I called the pt and let her know. She was very Patent attorney. I also faxed the notification to the pt's pharmacy. Received a receipt of confirmation.

## 2019-02-07 NOTE — Telephone Encounter (Signed)
See phone note from 11/12 labeled aimovig pa. Pt was notified just now of the approval.

## 2019-02-07 NOTE — Telephone Encounter (Signed)
Pt called in to check status of PA

## 2019-07-25 ENCOUNTER — Telehealth: Payer: Self-pay | Admitting: Neurology

## 2019-07-25 MED ORDER — AIMOVIG 140 MG/ML ~~LOC~~ SOAJ: SUBCUTANEOUS | 2 refills | Status: DC

## 2019-07-25 MED ORDER — AIMOVIG 140 MG/ML ~~LOC~~ SOAJ: SUBCUTANEOUS | 0 refills | Status: DC

## 2019-07-25 NOTE — Telephone Encounter (Signed)
Pt called stating that she is needing a refill on her AIMOVIG 140 MG/ML SOAJ sent in to the [facility] on [address] In [place] Pt would like to be called when the medication is called in to the pharmacy.

## 2019-07-25 NOTE — Telephone Encounter (Signed)
Spoke with pt and let her know I sent in Aimovig refills. Pt still has a balance to pay with pharmacy for her botox but she is working on this and will be in touch. Pt aware that if she is unable to continue botox we will need a follow up visit. Pt aware she can see NPs as well. Pt was very appreciative for the call. Will wait to schedule any appts until pt lets [place] know when she can do botox again.

## 2019-08-30 ENCOUNTER — Encounter: Payer: Self-pay | Admitting: Adult Health

## 2019-08-30 ENCOUNTER — Ambulatory Visit (INDEPENDENT_AMBULATORY_CARE_PROVIDER_SITE_OTHER): Payer: 59 | Admitting: Adult Health

## 2019-08-30 VITALS — BP 157/94 | HR 59 | Ht 66.0 in | Wt 217.0 lb

## 2019-08-30 DIAGNOSIS — G43709 Chronic migraine without aura, not intractable, without status migrainosus: Secondary | ICD-10-CM

## 2019-08-30 MED ORDER — NURTEC 75 MG PO TBDP: ORAL_TABLET | ORAL | 5 refills | Status: DC

## 2019-08-30 NOTE — Patient Instructions (Signed)
Your Plan:  Continue aimovig  Start Nurtec for abortive therapy: take 1 tablet at the onset of  Migraine. 1 tablet in 24 hours.  If your symptoms worsen or you develop new symptoms please let us know.   Thank you for coming to see [place] at [facility]. I hope we have been able to provide you high quality care today.  You may receive a patient satisfaction survey over the next few weeks. We would appreciate your feedback and comments so that we may continue to improve ourselves and the health of our patients.  Rimegepant oral dissolving tablet What is this medicine? RIMEGEPANT (ri ME je pant) is used to treat migraine headaches with or without aura. An aura is a strange feeling or visual disturbance that warns you of an attack. It is not used to prevent migraines. This medicine may be used for other purposes; ask your health care provider or pharmacist if you have questions. COMMON BRAND NAME(S): NURTEC ODT What should I tell my health care provider before I take this medicine? They need to know if you have any of these conditions:  kidney disease  liver disease  an unusual or allergic reaction to rimegepant, other medicines, foods, dyes, or preservatives  pregnant or trying to get pregnant  breast-feeding How should I use this medicine? Take the medicine by mouth. Follow the directions on the prescription label. Leave the tablet in the sealed blister pack until you are ready to take it. With dry hands, open the blister and gently remove the tablet. If the tablet breaks or crumbles, throw it away and take a new tablet out of the blister pack. Place the tablet in the mouth and allow it to dissolve, and then swallow. Do not cut, crush, or chew this medicine. You do not need water to take this medicine. Talk to your pediatrician about the use of this medicine in children. Special care may be needed. Overdosage: If you think you have taken too much of this medicine contact a  poison control center or emergency room at once. NOTE: This medicine is only for you. Do not share this medicine with others. What if I miss a dose? This does not apply. This medicine is not for regular use. What may interact with this medicine? This medicine may interact with the following medications:  certain medicines for fungal infections like fluconazole, itraconazole  rifampin This list may not describe all possible interactions. Give your health care provider a list of all the medicines, herbs, non-prescription drugs, or dietary supplements you use. Also tell them if you smoke, drink alcohol, or use illegal drugs. Some items may interact with your medicine. What should I watch for while using this medicine? Visit your health care professional for regular checks on your progress. Tell your health care professional if your symptoms do not start to get better or if they get worse. What side effects may I notice from receiving this medicine? Side effects that you should report to your doctor or health care professional as soon as possible:  allergic reactions like skin rash, itching or hives; swelling of the face, lips, or tongue Side effects that usually do not require medical attention (report these to your doctor or health care professional if they continue or are bothersome):  nausea This list may not describe all possible side effects. Call your doctor for medical advice about side effects. You may report side effects to FDA at 1-800-FDA-1088. Where should I keep my medicine? Keep  out of the reach of children. Store at room temperature between 15 and 30 degrees C (59 and 86 degrees F). Throw away any unused medicine after the expiration date. NOTE: This sheet is a summary. It may not cover all possible information. If you have questions about this medicine, talk to your doctor, pharmacist, or health care provider.  2020 Elsevier/Gold Standard (2018-05-17 00:21:31)
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
## 2019-09-01 ENCOUNTER — Telehealth: Payer: Self-pay | Admitting: Adult Health

## 2019-09-01 NOTE — Progress Notes (Signed)
PA submitted for Nurtec 75mg  mg on Cover my Meds. There is a 24/72hr turn around. Key Status:PENDING

## 2019-09-01 NOTE — Telephone Encounter (Signed)
Placed in work que for PA. 

## 2019-09-01 NOTE — Telephone Encounter (Signed)
Pt has called to report she has been told that the Nurtec is coming back as non formulary and that the office needs to either pursue a PA or another medication for her.  Please call

## 2019-09-05 NOTE — Progress Notes (Signed)
Pa was denied. Pt must have documentation of 1 month resulting in theraputic failure, contraindication or intolerance to one of the following: Axert, Relpax, Frova, Naratriptan, Sumatriptan or Zolmitriptan.  Failed and can not use a 1 month trial of the following 4:  Ubrelvy, Frova, Imitrex, Zomig, Amerge

## 2019-09-07 NOTE — Progress Notes (Signed)
Please advise patient that if she can try Imitrex as an abortive therapy providing that she tolerated Maxalt without any side effects.

## 2019-09-12 NOTE — Progress Notes (Signed)
Then she should meet the qualifications for Nurtec?

## 2019-09-22 NOTE — Progress Notes (Signed)
Submitted PA for Nurtec 75mg  through PA [phone]  PA Case ID: - [id number]   Status: Approved for #8 tab for 30 days Prior Auth: Coverage Start Date: 09/22/19 Coverage End Date: 09/21/20

## 2019-11-19 ENCOUNTER — Other Ambulatory Visit: Payer: Self-pay | Admitting: Neurology

## 2019-12-28 ENCOUNTER — Other Ambulatory Visit: Payer: Self-pay | Admitting: Neurology

## 2020-01-11 NOTE — Telephone Encounter (Signed)
Completed renewal PA for Aimovig 140 mg on Cover My Meds. Key: [id number]. Awaiting determination from [facility].

## 2020-01-17 NOTE — Telephone Encounter (Signed)
Per Cover My Meds, Request Reference Number: [id number]. AIMOVIG INJ 140MG /ML is approved through 01/10/2021. Your patient may now fill this prescription and it will be covered.   Faxed [facility] notification about approval dates. Received a receipt of confirmation.

## 2020-02-23 ENCOUNTER — Telehealth: Payer: Self-pay | Admitting: Adult Health

## 2020-02-23 NOTE — Telephone Encounter (Signed)
Pt called wanting to speak to the RN about her AIMOVIG 140 MG/ML SOAJ and the booster shot she just got on December 4th. Pt would like to know if it is alright to get her AIMOVIG 140 MG/ML SOAJ so soon after the booster shot. Please advise.

## 2020-02-23 NOTE — Telephone Encounter (Signed)
Spoke to MM/NP and relayed should not be a problem.  I relayed this to pt via VM.  She is to call back if questions.

## 2020-08-29 ENCOUNTER — Ambulatory Visit: Payer: 59 | Admitting: Adult Health

## 2020-09-06 ENCOUNTER — Ambulatory Visit: Payer: 59 | Admitting: Adult Health

## 2020-09-06 ENCOUNTER — Encounter: Payer: Self-pay | Admitting: Adult Health

## 2020-12-29 ENCOUNTER — Other Ambulatory Visit: Payer: Self-pay | Admitting: Adult Health

## 2020-12-31 ENCOUNTER — Telehealth: Payer: Self-pay | Admitting: *Deleted

## 2020-12-31 NOTE — Telephone Encounter (Signed)
Spoke to patient has appointment will do PA for aimovig

## 2021-01-01 ENCOUNTER — Telehealth: Payer: Self-pay | Admitting: *Deleted

## 2021-01-01 NOTE — Telephone Encounter (Signed)
[employer] has approved coverage of Aimovig from 01/01/2021-01/01/2022. Reference #:[id number].

## 2021-01-01 NOTE — Telephone Encounter (Addendum)
[name] ([place]) Rx #: [id number] Aimovig 140MG /ML auto-injectors  Sent PA for Aimovig this am waiting on approval

## 2021-01-01 NOTE — Telephone Encounter (Signed)
Samantha Estrada (Samantha Estrada) Rx #: [id number] Nurtec 75MG  dispersible tablets  Sent PA for Nurtec  waiting on approval

## 2021-02-04 NOTE — Telephone Encounter (Signed)
Denied. No reason was given on CMM.   PA Case: [id number], Status: Denied. Notification: Completed.

## 2021-02-04 NOTE — Telephone Encounter (Signed)
I called [employer] and they did not have her in system.

## 2021-02-05 NOTE — Telephone Encounter (Signed)
[employer] ID [id number] used for recent PA's.

## 2021-02-05 NOTE — Telephone Encounter (Signed)
I see that pt has [employer].  Will need to call pharmacy.

## 2021-02-06 ENCOUNTER — Telehealth (INDEPENDENT_AMBULATORY_CARE_PROVIDER_SITE_OTHER): Payer: Self-pay | Admitting: Neurology

## 2021-02-06 DIAGNOSIS — G43709 Chronic migraine without aura, not intractable, without status migrainosus: Secondary | ICD-10-CM

## 2021-02-06 DIAGNOSIS — G43009 Migraine without aura, not intractable, without status migrainosus: Secondary | ICD-10-CM | POA: Insufficient documentation

## 2021-02-06 MED ORDER — AIMOVIG 140 MG/ML ~~LOC~~ SOAJ: 140.0000 mg | SUBCUTANEOUS | 0 refills | Status: DC
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
## 2021-02-19 NOTE — Telephone Encounter (Signed)
Received another Nurtec PA. Completed this on Cover My Meds. Key: [id number]. Awaiting determination from [employer].

## 2021-02-21 ENCOUNTER — Telehealth: Payer: Self-pay | Admitting: Neurology

## 2021-02-21 DIAGNOSIS — G43709 Chronic migraine without aura, not intractable, without status migrainosus: Secondary | ICD-10-CM

## 2021-02-21 MED ORDER — EMGALITY 120 MG/ML ~~LOC~~ SOAJ: 120.0000 mg | SUBCUTANEOUS | 5 refills | Status: DC

## 2021-02-21 NOTE — Telephone Encounter (Signed)
I reviewed the last office note. It refers to patient applying for financial assistance for Aimovig but no mention of a new medication.

## 2021-02-21 NOTE — Telephone Encounter (Signed)
Spoke with patient. She states she had discussed with Dr Lucia Gaskins starting a new medication since there are side effects to the Aimovig. I let her know a message would be sent to Dr Lucia Gaskins. She requests a call back today, before the weekend. She verbalized appreciation.

## 2021-02-21 NOTE — Telephone Encounter (Signed)
Pt states when she last saw Dr Lucia Gaskins she was told in a couple of days she would hear from someone re: starting another medication, pt hasn't heard from anyone.  Pt is asking for a call re: the status of the new medication.

## 2021-02-21 NOTE — Telephone Encounter (Addendum)
Spoke with the patient and discussed the message from Dr. Lucia Gaskins.  Pt is agreeable to trying Emgality instead of Aimovig.  We discussed the medication and instructions.  I let her know I would send more details to her MyChart.  We will send to pharmacy and see if covered by insurance.  Patient was very appreciative and her questions were answered.  Aimovig d/c'd. Emgality 120 mg SQ q30 days sent to pharmacy. Will do PA if required.

## 2021-02-21 NOTE — Addendum Note (Signed)
Addended by: Bertram Savin on: 02/21/2021 02:31 PM   Modules accepted: Orders

## 2021-02-21 NOTE — Telephone Encounter (Signed)
Need pt's insurance info. Messaged her in [place] d/t being unable to get in touch with her pharmacy. Waited 5 minutes on phone.

## 2021-02-27 NOTE — Telephone Encounter (Addendum)
Appeal done, fax to [phone], ofv [phone] ref [id number]. (Received fax confirmation).

## 2021-03-04 NOTE — Telephone Encounter (Addendum)
Noted thanks. We received a fax asking either insurance to be called or mail the appeal to the grievances dept at:  [employer] [id number] [place], [place] [address]

## 2021-03-04 NOTE — Telephone Encounter (Signed)
Pt is asking for a call with an update re: medication, she was made aware of Sandy,RN's last entry from 12-14

## 2021-03-05 NOTE — Telephone Encounter (Signed)
I called the [employer] (w/ [employer]). Spoke to [place].   [phone] that did the reconsideration for Nurtec 75mg  tablets REF # . It has to go to clinical review (could take up to 5 days). [phone] fax.   FAXED confirmation received. Determination is pending.

## 2021-03-07 NOTE — Telephone Encounter (Addendum)
Approval given for Nurtec 75mg  talbets effective 03-06-2021 thru 03-06-2022. [employer]. I relayed to pt.  Faxed confirmation received for Nurtec.  She is now asking about emgality.  Was prescribed 02-21-2021, she has not heard anything.  I do see some notes relating to this but do not know if PA needed.  She will check at pharmacy and then let 14-10-2020 know.

## 2021-03-12 ENCOUNTER — Telehealth: Payer: Self-pay | Admitting: *Deleted

## 2021-03-12 NOTE — Telephone Encounter (Signed)
See phone note . Emgality PA  was approved

## 2021-03-12 NOTE — Telephone Encounter (Signed)
Logan with Cover my Meds called following up on a PA for the Galcanezumab-gnlm (EMGALITY) 120 MG/ML SOAJ. Ref key#: [id number].

## 2021-03-12 NOTE — Telephone Encounter (Signed)
PA Case: [id number], Status: Approved, Coverage Starts on: 03/12/2021 12:00:00 AM, Coverage Ends on: 06/10/2021 12:00:00 A  PA for Emgality has been approved . Will contact pt to make her aware

## 2021-03-12 NOTE — Telephone Encounter (Signed)
Samantha Estrada (Key: [id number]) Emgality 120MG /ML auto-injectors (migraine)  PA for Emgality was sent this afternoon waiting on approval.   Patient changed insurance company when patient comes in for OV  please scan new insurance card .

## 2021-04-09 ENCOUNTER — Telehealth: Payer: Self-pay | Admitting: Neurology

## 2021-04-09 DIAGNOSIS — G43709 Chronic migraine without aura, not intractable, without status migrainosus: Secondary | ICD-10-CM

## 2021-04-09 MED ORDER — NURTEC 75 MG PO TBDP: ORAL_TABLET | ORAL | 11 refills | Status: DC

## 2021-04-09 NOTE — Addendum Note (Signed)
Addended by: Bertram Savin on: 04/09/2021 02:34 PM   Modules accepted: Orders

## 2021-04-09 NOTE — Telephone Encounter (Signed)
Pt is requesting a refill of her w Rimegepant Sulfate (NURTEC) 75 MG TBDP. Would like a call when this is sent.

## 2021-04-09 NOTE — Telephone Encounter (Signed)
Spoke with pt. Nurtec refills sent to [facility] in [place] on [address]. Pt very appreciative. Current auth through 03/06/22. Pt reports no changes to insurance this year.

## 2021-07-03 ENCOUNTER — Telehealth: Payer: Self-pay | Admitting: Neurology

## 2021-07-03 NOTE — Telephone Encounter (Signed)
I called the patient. She feels Emgality is working. She does feel it wear off at the end of the month. She would like to continue it. Will send request to insurance.  ?

## 2021-07-03 NOTE — Telephone Encounter (Signed)
Patient aware Emgality has been approved through April 2024. She was very appreciative for the call back.  ?

## 2021-07-03 NOTE — Telephone Encounter (Signed)
Pt called stating that her Galcanezumab-gnlm (EMGALITY) 120 MG/ML SOAJ is needing a PA. Please advise. ?

## 2021-07-03 NOTE — Telephone Encounter (Signed)
Submited PA on CMM. Key: [id number].Received instant approval: "PA Case: [id number], Status: Approved, Coverage Starts on: 07/03/2021 12:00:00 AM, Coverage Ends on: 07/03/2022 12:00:00 AM." ?

## 2021-08-26 ENCOUNTER — Other Ambulatory Visit: Payer: Self-pay | Admitting: Neurology

## 2021-08-26 DIAGNOSIS — G43709 Chronic migraine without aura, not intractable, without status migrainosus: Secondary | ICD-10-CM

## 2021-09-25 ENCOUNTER — Telehealth: Payer: Self-pay | Admitting: Neurology

## 2021-09-25 NOTE — Telephone Encounter (Signed)
I spoke with the patient and let her know the doses of the CGRP injections are different per injection and cannot be compared. Unable to increase dose of Emgality. Recommend appt to discuss medications as it has been 8 months since we have seen her. Pt was scheduled for a mychart video visit with Megan NP on 8/10 @ 2 pm, join visit at 1:45 pm. Pt verbalized appreciation for the call and is amenable to this plan.

## 2021-09-25 NOTE — Telephone Encounter (Signed)
Pt states the strength of her previous medication was 140mg  the strength of her EMGALITY 120 MG/ML SOAJ, is not really helping, pt would like to know if she can have th strength of her Emgality increased.

## 2021-10-24 ENCOUNTER — Telehealth: Payer: Self-pay | Admitting: Adult Health

## 2021-12-28 ENCOUNTER — Other Ambulatory Visit: Payer: Self-pay | Admitting: Neurology

## 2021-12-28 DIAGNOSIS — G43709 Chronic migraine without aura, not intractable, without status migrainosus: Secondary | ICD-10-CM

## 2021-12-30 ENCOUNTER — Telehealth: Payer: Self-pay | Admitting: Neurology

## 2021-12-30 NOTE — Telephone Encounter (Signed)
Pt states [place] [phone]  is waiting to hear from Dr Cathren Laine office re: pt's EMGALITY 120 MG/ML SOAJ.  Pt is asking RN calls pharmacy so she is able to get her EMGALITY (pt confirmed her upcoming Dr Jaynee Eagles f/u)

## 2021-12-31 NOTE — Telephone Encounter (Signed)
We had received a refill request which has been approved. Pt will be seen here for follow-up this month.

## 2022-01-06 ENCOUNTER — Telehealth: Payer: Self-pay | Admitting: Neurology

## 2022-01-06 ENCOUNTER — Telehealth (INDEPENDENT_AMBULATORY_CARE_PROVIDER_SITE_OTHER): Payer: BC Managed Care – PPO | Admitting: Neurology

## 2022-01-06 ENCOUNTER — Encounter: Payer: Self-pay | Admitting: Neurology

## 2022-01-06 DIAGNOSIS — G43009 Migraine without aura, not intractable, without status migrainosus: Secondary | ICD-10-CM | POA: Diagnosis not present

## 2022-01-06 MED ORDER — PROMETHAZINE HCL 25 MG PO TABS: 25.0000 mg | ORAL_TABLET | Freq: Four times a day (QID) | ORAL | 6 refills | Status: AC | PRN

## 2022-01-06 MED ORDER — UBRELVY 100 MG PO TABS: 100.0000 mg | ORAL_TABLET | ORAL | 11 refills | Status: DC | PRN

## 2022-01-06 MED ORDER — QULIPTA 60 MG PO TABS: 60.0000 mg | ORAL_TABLET | Freq: Every day | ORAL | 11 refills | Status: DC

## 2022-01-06 NOTE — Telephone Encounter (Signed)
..   Pt understands that although there may be some limitations with this type of visit, we will take all precautions to reduce any security or privacy concerns.  Pt understands that this will be treated like an in office visit and we will file with pt's insurance, and there may be a patient responsible charge related to this service. ? ?

## 2022-01-06 NOTE — Telephone Encounter (Signed)
Pt has called back to report that [facility] order pharmacy is telling her that because she lives in [place] and they can not send the medications to her, please call her to discuss.

## 2022-01-06 NOTE — Telephone Encounter (Signed)
Please give the pt this phone number for [facility] mail order pharmacy: [phone]. They have the prescription for the [place] and [place].

## 2022-01-06 NOTE — Telephone Encounter (Signed)
Please make appointment in 6 months video with patient with an NP please thanks
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
## 2022-01-06 NOTE — Telephone Encounter (Signed)
Pt is calling. Said she talked to someone about getting medication mail to her. Pt is requesting there phone number because sh want to talk to them again. Pt is requesting a call back from nurse.

## 2022-01-06 NOTE — Telephone Encounter (Signed)
Current Insurance information: [employer] Member ID: [id number] Group# [id number]  BIN# [id number] PCN: [id number]

## 2022-01-06 NOTE — Telephone Encounter (Signed)
Called Pt back and gave her the number to [place] and [place].Samantha Estrada

## 2022-01-06 NOTE — Patient Instructions (Addendum)
Stop emgality Daily prevention; Start Qulipta(Atpgepant) As needed for migraine: Take Ubrelvy: Please take one tablet at the onset of your headache. If it does not improve the symptoms please take one additional tablet. Do not take more then 2 tablets in 24hrs. Do not take use more then 2 to 3 times in a week. She needs to fax [place] her new insurance before we prescribe You will get a call from a pharmacy called [facility] for Bennie Pierini and Bernita Raisin   Ubrogepant Tablets What is this medication? UBROGEPANT (ue BROE je pant) treats migraines. It works by blocking a substance in the body that causes migraines. It is not used to prevent migraines. This medicine may be used for other purposes; ask your health care provider or pharmacist if you have questions. COMMON BRAND NAME(S): Bernita Raisin What should I tell my care team before I take this medication? They need to know if you have any of these conditions: Kidney disease Liver disease An unusual or allergic reaction to ubrogepant, other medications, foods, dyes, or preservatives Pregnant or trying to get pregnant Breast-feeding How should I use this medication? Take this medication by mouth with a glass of water. Take it as directed on the prescription label. You can take it with or without food. If it upsets your stomach, take it with food. Keep taking it unless your care team tells you to stop. Talk to your care team about the use of this medication in children. Special care may be needed. Overdosage: If you think you have taken too much of this medicine contact a poison control center or emergency room at once. NOTE: This medicine is only for you. Do not share this medicine with others. What if I miss a dose? This does not apply. This medication is not for regular use. What may interact with this medication? Do not take this medication with any of the following: Adagrasib Ceritinib Certain antibiotics, such as chloramphenicol, clarithromycin,  telithromycin Certain antivirals for HIV, such as atazanavir, cobicistat, darunavir, delavirdine, fosamprenavir, indinavir, ritonavir Certain medications for fungal infections, such as itraconazole, ketoconazole, posaconazole, voriconazole Conivaptan Grapefruit Idelalisib Mifepristone Nefazodone Ribociclib This medication may also interact with the following: Carvedilol Certain medications for seizures, such as phenobarbital, phenytoin Ciprofloxacin Cyclosporine Eltrombopag Fluconazole Fluvoxamine Quinidine Rifampin St. John's wort Verapamil This list may not describe all possible interactions. Give your health care provider a list of all the medicines, herbs, non-prescription drugs, or dietary supplements you use. Also tell them if you smoke, drink alcohol, or use illegal drugs. Some items may interact with your medicine. What should I watch for while using this medication? Visit your care team for regular checks on your progress. Tell your care team if your symptoms do not start to get better or if they get worse. Your mouth may get dry. Chewing sugarless gum or sucking hard candy and drinking plenty of water may help. Contact your care team if the problem does not go away or is severe. What side effects may I notice from receiving this medication? Side effects that you should report to your care team as soon as possible: Allergic reactions--skin rash, itching, hives, swelling of the face, lips, tongue, or throat Side effects that usually do not require medical attention (report to your care team if they continue or are bothersome): Drowsiness Dry mouth Fatigue Nausea This list may not describe all possible side effects. Call your doctor for medical advice about side effects. You may report side effects to FDA at 1-800-FDA-1088. Where should I  keep my medication? Keep out of the reach of children and pets. Store between 15 and 30 degrees C (59 and 86 degrees F). Get rid of any  unused medication after the expiration date. To get rid of medications that are no longer needed or have expired: Take the medication to a medication take-back program. Check with your pharmacy or law enforcement to find a location. If you cannot return the medication, check the label or package insert to see if the medication should be thrown out in the garbage or flushed down the toilet. If you are not sure, ask your care team. If it is safe to put it in the trash, pour the medication out of the container. Mix the medication with cat litter, dirt, coffee grounds, or other unwanted substance. Seal the mixture in a bag or container. Put it in the trash. NOTE: This sheet is a summary. It may not cover all possible information. If you have questions about this medicine, talk to your doctor, pharmacist, or health care provider.  2023 Elsevier/Gold Standard (2021-03-15 00:00:00) Atogepant Tablets What is this medication? ATOGEPANT (a TOE je pant) prevents migraines. It works by blocking a substance in the body that causes migraines. This medicine may be used for other purposes; ask your health care provider or pharmacist if you have questions. COMMON BRAND NAME(S): QULIPTA What should I tell my care team before I take this medication? They need to know if you have any of these conditions: Kidney disease Liver disease An unusual or allergic reaction to atogepant, other medications, foods, dyes, or preservatives Pregnant or trying to get pregnant Breast-feeding How should I use this medication? Take this medication by mouth with water. Take it as directed on the prescription label at the same time every day. You can take it with or without food. If it upsets your stomach, take it with food. Keep taking it unless your care team tells you to stop. Talk to your care team about the use of this medication in children. Special care may be needed. Overdosage: If you think you have taken too much of this  medicine contact a poison control center or emergency room at once. NOTE: This medicine is only for you. Do not share this medicine with others. What if I miss a dose? If you miss a dose, take it as soon as you can. If it is almost time for your next dose, take only that dose. Do not take double or extra doses. What may interact with this medication? Carbamazepine Certain medications for fungal infections, such as itraconazole, ketoconazole Clarithromycin Cyclosporine Efavirenz Etravirine Phenytoin Rifampin St. John's wort This list may not describe all possible interactions. Give your health care provider a list of all the medicines, herbs, non-prescription drugs, or dietary supplements you use. Also tell them if you smoke, drink alcohol, or use illegal drugs. Some items may interact with your medicine. What should I watch for while using this medication? Visit your care team for regular checks on your progress. Tell your care team if your symptoms do not start to get better or if they get worse. What side effects may I notice from receiving this medication? Side effects that you should report to your care team as soon as possible: Allergic reactions--skin rash, itching, hives, swelling of the face, lips, tongue, or throat Side effects that usually do not require medical attention (report to your care team if they continue or are bothersome): Constipation Fatigue Loss of appetite with weight loss Nausea  This list may not describe all possible side effects. Call your doctor for medical advice about side effects. You may report side effects to FDA at 1-800-FDA-1088. Where should I keep my medication? Keep out of the reach of children and pets. Store at room temperature between 20 and 25 degrees C (68 and 77 degrees F). Get rid of any unused medication after the expiration date. To get rid of medications that are no longer needed or have expired: Take the medication to a medication  take-back program. Check with your pharmacy or law enforcement to find a location. If you cannot return the medication, check the label or package insert to see if the medication should be thrown out in the garbage or flushed down the toilet. If you are not sure, ask your care team. If it is safe to put it in the trash, take the medication out of the container. Mix the medication with cat litter, dirt, coffee grounds, or other unwanted substance. Seal the mixture in a bag or container. Put it in the trash. NOTE: This sheet is a summary. It may not cover all possible information. If you have questions about this medicine, talk to your doctor, pharmacist, or health care provider.  2023 Elsevier/Gold Standard (2021-04-22 00:00:00)

## 2022-01-06 NOTE — Telephone Encounter (Signed)
Scheduled VV with Megan for 07/08/22 at 2:45 pm.

## 2022-01-07 MED ORDER — UBRELVY 100 MG PO TABS: 100.0000 mg | ORAL_TABLET | ORAL | 11 refills | Status: DC | PRN

## 2022-01-07 MED ORDER — QULIPTA 60 MG PO TABS: 60.0000 mg | ORAL_TABLET | Freq: Every day | ORAL | 11 refills | Status: DC

## 2022-01-07 NOTE — Addendum Note (Signed)
Addended by: Gildardo Griffes on: 01/07/2022 10:42 AM   Modules accepted: Orders

## 2022-01-07 NOTE — Telephone Encounter (Signed)
Pt called back and stated that she is ok with her medications being sent to the [facility] on [address]. [employer].

## 2022-01-07 NOTE — Telephone Encounter (Signed)
Noted, prescriptions for Ubrelvy & Lenoria Chime have been to [facility] on [address] in [place].

## 2022-01-09 ENCOUNTER — Telehealth: Payer: Self-pay | Admitting: *Deleted

## 2022-01-09 NOTE — Telephone Encounter (Signed)
[place] PA, Key: [id number] Faxed noted to be attached to key.

## 2022-01-09 NOTE — Telephone Encounter (Signed)
[place] PA, Key: [id number], faxed noted to be attached.

## 2022-01-09 NOTE — Telephone Encounter (Signed)
Your information has been sent to [facility]. [employer] has not replied to your PA request. Turnaround time for review of a PA request is dependent upon insurance plan and can range from 24 hours to 5 calendar days. If this search option is not available or [employer] has not replied to your request within this timeframe, please contact the number on the back of the patient's insurance card.

## 2022-01-09 NOTE — Telephone Encounter (Signed)
Qulipta approval letter faxed to [facility]. Received a receipt of confirmation.

## 2022-01-09 NOTE — Telephone Encounter (Signed)
PA for [facility] sent before notes attached. Called [facility], spoke with Marquitta who gave Fax [phone] to send notes. Supporting notes faxed, received confirmation.

## 2022-01-09 NOTE — Telephone Encounter (Signed)
Documents attached. Your information has been sent to [employer]. PA Case: [id number], Status: Approved, Coverage Starts on: 01/09/2022 12:00:00 AM, Coverage Ends on: 04/09/2022 12:00:00 AM.

## 2022-01-13 NOTE — Telephone Encounter (Signed)
Samantha Estrada Approved today PA Case: [id number], Status: Approved, Coverage Starts on: 01/13/2022 12:00:00 AM, Coverage Ends on: 01/13/2023 12:00:00 AM

## 2022-07-01 ENCOUNTER — Telehealth: Payer: Self-pay | Admitting: Adult Health

## 2022-07-01 NOTE — Telephone Encounter (Signed)
Mychart messahe due to 2 appointments need to cancel 1

## 2022-07-08 ENCOUNTER — Ambulatory Visit: Payer: Self-pay | Admitting: Adult Health

## 2022-07-17 ENCOUNTER — Encounter: Payer: Self-pay | Admitting: Adult Health

## 2022-07-17 ENCOUNTER — Ambulatory Visit: Payer: Self-pay | Admitting: Adult Health

## 2023-05-26 ENCOUNTER — Telehealth: Payer: Self-pay | Admitting: Neurology

## 2023-05-26 NOTE — Telephone Encounter (Signed)
 Schedule an appointment due to migraines having a migraine for 2 weeks. Taking Excedrin migraine have relief for about a couple of hours. Have taking all of Atogepant (QULIPTA) 60 MG TABS prescribed by  Dr. Lucia Gaskins   Have scheduled next available appt on 09/09/23 at 11:00 am

## 2023-05-26 NOTE — Telephone Encounter (Signed)
 I called pt.  She will upload her new insurance cards to [employer].  She has appt 08/2023, and  waitlisted.  She says she has been taking qulipta, I toldher can renew until her appt. Will require a PA. I gave her the [employer] # as well.  She verbalized plan.

## 2023-05-27 ENCOUNTER — Other Ambulatory Visit: Payer: Self-pay | Admitting: *Deleted

## 2023-05-27 MED ORDER — QULIPTA 60 MG PO TABS: 60.0000 mg | ORAL_TABLET | Freq: Every day | ORAL | 3 refills | Status: AC

## 2023-06-08 ENCOUNTER — Telehealth: Payer: Self-pay

## 2023-06-08 ENCOUNTER — Other Ambulatory Visit (HOSPITAL_COMMUNITY): Payer: Self-pay

## 2023-06-08 NOTE — Telephone Encounter (Signed)
 Pharmacy Patient Advocate Encounter   Received notification from CoverMyMeds that prior authorization for Qulipta 60MG  tablets is due for renewal.   Insurance verification completed.   The patient is insured through  [employer] .  Action: Patient hasn't been seen in your office in over a year. Plan requires updated chart notes for PA renewal.

## 2023-06-09 NOTE — Telephone Encounter (Signed)
 It is a Paper form so will need actual office notes-we are not allowed to submit PA if a PT has not been seen within a year time frame. Unable to submit via CMM.

## 2023-06-09 NOTE — Telephone Encounter (Signed)
 Pt returned my call. She understands we need visit before we can do PA. She cannot come tomorrow. Other available days do not correlate with a sooner opening at this time but pt is ok with this and remains on wait list. Pt can try to call us if she runs out of Qulipta. She was appreciative for the call.

## 2023-06-09 NOTE — Telephone Encounter (Signed)
 I called the patient and LVM (ok per DPR) asking for call back asap to schedule appointment sooner. I informed her that we cannot do a PA for Qulipta since we haven't seen her in over a year. Left office number for call back.

## 2023-09-09 ENCOUNTER — Encounter: Payer: Self-pay | Admitting: Neurology

## 2023-09-09 ENCOUNTER — Telehealth: Payer: Self-pay | Admitting: *Deleted

## 2023-09-09 ENCOUNTER — Ambulatory Visit: Admitting: Neurology

## 2023-09-09 VITALS — BP 143/83 | HR 59 | Ht 65.0 in | Wt 211.6 lb

## 2023-09-09 DIAGNOSIS — G43709 Chronic migraine without aura, not intractable, without status migrainosus: Secondary | ICD-10-CM | POA: Diagnosis not present

## 2023-09-09 NOTE — Telephone Encounter (Signed)
 Submitted benefit verification, will update once results are received. [id number]

## 2023-09-09 NOTE — Telephone Encounter (Signed)
-----   Message from Onetha KATHEE Epp sent at 09/09/2023  1:04 PM EDT ----- Regarding: start botox for migraine Please start botox for migraine approval. Patient can go to AN NP please. Thank you [phone]
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
## 2023-09-09 NOTE — Telephone Encounter (Signed)
Chronic Migraine CPT 64615    Botox J0585 Units:155 units   G43.709 Chronic Migraine without aura, not intractable, without status migrainous

## 2023-09-14 NOTE — Telephone Encounter (Signed)
 Submitted auth request via CMM, status is pending. Key: [id number]

## 2023-09-21 NOTE — Telephone Encounter (Signed)
 Received fax from [facility] requesting additional information for auth. Answered questions attached and faxed back to [phone].

## 2023-09-22 NOTE — Telephone Encounter (Signed)
 Patient returned phone call, relayed phone note. Ask patient if wanted to be transferred to Billing. Patient stated, not today; got to figure out of going on.

## 2023-09-22 NOTE — Telephone Encounter (Signed)
 Received fax of approval for pt to receive Botox as SP through [facility]. Auth#: [id number] (09/14/23-03/12/24)  Patient has an old balance ($423.30) from 2019-2020 Botox appointments. I called patient and left a VM informing her that we can't schedule any future Botox appointments until this has been paid.

## 2023-10-05 ENCOUNTER — Other Ambulatory Visit: Payer: Self-pay | Admitting: *Deleted

## 2023-10-05 ENCOUNTER — Telehealth: Payer: Self-pay | Admitting: Neurology

## 2023-10-05 NOTE — Telephone Encounter (Signed)
 Pt states that  MD was suppose to get PT new medication Nurtec instead  of qulipta . Pt states she was not sure if MD was still going to Prescribe medication .

## 2023-10-06 ENCOUNTER — Other Ambulatory Visit: Payer: Self-pay | Admitting: *Deleted

## 2023-10-06 DIAGNOSIS — G43009 Migraine without aura, not intractable, without status migrainosus: Secondary | ICD-10-CM

## 2023-10-06 MED ORDER — NURTEC 75 MG PO TBDP: ORAL_TABLET | ORAL | 11 refills | Status: AC

## 2023-10-06 NOTE — Telephone Encounter (Signed)
 I spoke to pt and she has been taking qulipta  and nurtec prn.  Needs rx.  We last saw her in 2023, but Dr. Ines just saw 08-2023.  Ok to renew nurtec.  Order sent to Dr. Ines .

## 2023-10-21 ENCOUNTER — Telehealth: Payer: Self-pay

## 2023-10-21 ENCOUNTER — Other Ambulatory Visit (HOSPITAL_COMMUNITY): Payer: Self-pay

## 2023-10-21 NOTE — Telephone Encounter (Signed)
 Pharmacy Patient Advocate Encounter   Received notification from CoverMyMeds that prior authorization for Nurtec is required/requested.   Insurance verification completed.   The patient is insured through [place] .   Per test claim: PA required; PA submitted to above mentioned insurance via Fax Key/confirmation #/EOC N/A Status is pending  Faxed to [phone]

## 2023-10-29 ENCOUNTER — Other Ambulatory Visit (HOSPITAL_COMMUNITY): Payer: Self-pay

## 2023-10-29 NOTE — Telephone Encounter (Signed)
 Pharmacy Patient Advocate Encounter  Received notification from [employer] that Prior Authorization for Nurtec has been APPROVED from 10/23/2023 to 10/22/2024   PA #/Case ID/Reference #: N/A  Phone rep states they do not have an approval code but will fax over approval letter to be placed intothe pt chart under the media tab. The pharmacy is also showing a paid claim do pt was able to get their meds.
